# Patient Record
Sex: Female | Born: 1935 | Race: White | Hispanic: No | Smoking: Never smoker
Health system: Southern US, Community
[De-identification: ages and names within clinical notes are randomized; demographics above are authoritative.]

## PROBLEM LIST (undated history)

## (undated) DIAGNOSIS — K439 Ventral hernia without obstruction or gangrene: Secondary | ICD-10-CM

## (undated) DIAGNOSIS — K5790 Diverticulosis of intestine, part unspecified, without perforation or abscess without bleeding: Secondary | ICD-10-CM

## (undated) DIAGNOSIS — S32010A Wedge compression fracture of first lumbar vertebra, initial encounter for closed fracture: Secondary | ICD-10-CM

## (undated) DIAGNOSIS — E119 Type 2 diabetes mellitus without complications: Secondary | ICD-10-CM

## (undated) DIAGNOSIS — K449 Diaphragmatic hernia without obstruction or gangrene: Secondary | ICD-10-CM

## (undated) DIAGNOSIS — I10 Essential (primary) hypertension: Secondary | ICD-10-CM

## (undated) HISTORY — PX: CHOLECYSTECTOMY: SHX55

## (undated) HISTORY — PX: ABDOMINAL HYSTERECTOMY: SHX81

## (undated) HISTORY — PX: HERNIA REPAIR: SHX51

---

## 2015-05-09 DIAGNOSIS — J069 Acute upper respiratory infection, unspecified: Secondary | ICD-10-CM | POA: Diagnosis not present

## 2015-05-21 DIAGNOSIS — N390 Urinary tract infection, site not specified: Secondary | ICD-10-CM | POA: Diagnosis not present

## 2015-06-05 DIAGNOSIS — R32 Unspecified urinary incontinence: Secondary | ICD-10-CM | POA: Diagnosis not present

## 2015-07-04 DIAGNOSIS — M79674 Pain in right toe(s): Secondary | ICD-10-CM | POA: Diagnosis not present

## 2015-07-04 DIAGNOSIS — B351 Tinea unguium: Secondary | ICD-10-CM | POA: Diagnosis not present

## 2015-07-04 DIAGNOSIS — M79675 Pain in left toe(s): Secondary | ICD-10-CM | POA: Diagnosis not present

## 2015-07-04 DIAGNOSIS — E119 Type 2 diabetes mellitus without complications: Secondary | ICD-10-CM | POA: Diagnosis not present

## 2015-07-09 DIAGNOSIS — N390 Urinary tract infection, site not specified: Secondary | ICD-10-CM | POA: Diagnosis not present

## 2015-07-09 DIAGNOSIS — R32 Unspecified urinary incontinence: Secondary | ICD-10-CM | POA: Diagnosis not present

## 2015-07-22 DIAGNOSIS — R197 Diarrhea, unspecified: Secondary | ICD-10-CM | POA: Diagnosis not present

## 2015-07-22 DIAGNOSIS — N390 Urinary tract infection, site not specified: Secondary | ICD-10-CM | POA: Diagnosis not present

## 2015-07-22 DIAGNOSIS — M549 Dorsalgia, unspecified: Secondary | ICD-10-CM | POA: Diagnosis not present

## 2015-07-23 DIAGNOSIS — R197 Diarrhea, unspecified: Secondary | ICD-10-CM | POA: Diagnosis not present

## 2015-08-27 DIAGNOSIS — I350 Nonrheumatic aortic (valve) stenosis: Secondary | ICD-10-CM | POA: Diagnosis not present

## 2015-08-27 DIAGNOSIS — I10 Essential (primary) hypertension: Secondary | ICD-10-CM | POA: Diagnosis not present

## 2015-08-27 DIAGNOSIS — Z954 Presence of other heart-valve replacement: Secondary | ICD-10-CM | POA: Diagnosis not present

## 2015-08-27 DIAGNOSIS — E785 Hyperlipidemia, unspecified: Secondary | ICD-10-CM | POA: Diagnosis not present

## 2015-09-15 DIAGNOSIS — M9905 Segmental and somatic dysfunction of pelvic region: Secondary | ICD-10-CM | POA: Diagnosis not present

## 2015-09-15 DIAGNOSIS — M9903 Segmental and somatic dysfunction of lumbar region: Secondary | ICD-10-CM | POA: Diagnosis not present

## 2015-09-18 DIAGNOSIS — M9905 Segmental and somatic dysfunction of pelvic region: Secondary | ICD-10-CM | POA: Diagnosis not present

## 2015-09-18 DIAGNOSIS — M9903 Segmental and somatic dysfunction of lumbar region: Secondary | ICD-10-CM | POA: Diagnosis not present

## 2015-09-24 DIAGNOSIS — M9905 Segmental and somatic dysfunction of pelvic region: Secondary | ICD-10-CM | POA: Diagnosis not present

## 2015-09-24 DIAGNOSIS — M9903 Segmental and somatic dysfunction of lumbar region: Secondary | ICD-10-CM | POA: Diagnosis not present

## 2015-09-26 DIAGNOSIS — M9903 Segmental and somatic dysfunction of lumbar region: Secondary | ICD-10-CM | POA: Diagnosis not present

## 2015-09-26 DIAGNOSIS — M9905 Segmental and somatic dysfunction of pelvic region: Secondary | ICD-10-CM | POA: Diagnosis not present

## 2015-09-29 DIAGNOSIS — M9903 Segmental and somatic dysfunction of lumbar region: Secondary | ICD-10-CM | POA: Diagnosis not present

## 2015-09-29 DIAGNOSIS — M9905 Segmental and somatic dysfunction of pelvic region: Secondary | ICD-10-CM | POA: Diagnosis not present

## 2015-09-30 DIAGNOSIS — R339 Retention of urine, unspecified: Secondary | ICD-10-CM | POA: Diagnosis not present

## 2015-09-30 DIAGNOSIS — N3941 Urge incontinence: Secondary | ICD-10-CM | POA: Diagnosis not present

## 2015-09-30 DIAGNOSIS — N952 Postmenopausal atrophic vaginitis: Secondary | ICD-10-CM | POA: Diagnosis not present

## 2015-09-30 DIAGNOSIS — R350 Frequency of micturition: Secondary | ICD-10-CM | POA: Diagnosis not present

## 2015-09-30 DIAGNOSIS — N816 Rectocele: Secondary | ICD-10-CM | POA: Diagnosis not present

## 2015-10-01 DIAGNOSIS — M9905 Segmental and somatic dysfunction of pelvic region: Secondary | ICD-10-CM | POA: Diagnosis not present

## 2015-10-01 DIAGNOSIS — M9903 Segmental and somatic dysfunction of lumbar region: Secondary | ICD-10-CM | POA: Diagnosis not present

## 2015-10-06 DIAGNOSIS — M9905 Segmental and somatic dysfunction of pelvic region: Secondary | ICD-10-CM | POA: Diagnosis not present

## 2015-10-06 DIAGNOSIS — M9903 Segmental and somatic dysfunction of lumbar region: Secondary | ICD-10-CM | POA: Diagnosis not present

## 2015-10-13 DIAGNOSIS — M9905 Segmental and somatic dysfunction of pelvic region: Secondary | ICD-10-CM | POA: Diagnosis not present

## 2015-10-13 DIAGNOSIS — M9903 Segmental and somatic dysfunction of lumbar region: Secondary | ICD-10-CM | POA: Diagnosis not present

## 2015-10-14 DIAGNOSIS — M79674 Pain in right toe(s): Secondary | ICD-10-CM | POA: Diagnosis not present

## 2015-10-14 DIAGNOSIS — M79675 Pain in left toe(s): Secondary | ICD-10-CM | POA: Diagnosis not present

## 2015-10-14 DIAGNOSIS — E119 Type 2 diabetes mellitus without complications: Secondary | ICD-10-CM | POA: Diagnosis not present

## 2015-10-14 DIAGNOSIS — B351 Tinea unguium: Secondary | ICD-10-CM | POA: Diagnosis not present

## 2015-10-15 DIAGNOSIS — M9905 Segmental and somatic dysfunction of pelvic region: Secondary | ICD-10-CM | POA: Diagnosis not present

## 2015-10-15 DIAGNOSIS — M9903 Segmental and somatic dysfunction of lumbar region: Secondary | ICD-10-CM | POA: Diagnosis not present

## 2015-10-17 DIAGNOSIS — M9905 Segmental and somatic dysfunction of pelvic region: Secondary | ICD-10-CM | POA: Diagnosis not present

## 2015-10-17 DIAGNOSIS — M9903 Segmental and somatic dysfunction of lumbar region: Secondary | ICD-10-CM | POA: Diagnosis not present

## 2015-10-21 DIAGNOSIS — N760 Acute vaginitis: Secondary | ICD-10-CM | POA: Diagnosis not present

## 2015-10-21 DIAGNOSIS — N3941 Urge incontinence: Secondary | ICD-10-CM | POA: Diagnosis not present

## 2015-10-21 DIAGNOSIS — R339 Retention of urine, unspecified: Secondary | ICD-10-CM | POA: Diagnosis not present

## 2015-10-21 DIAGNOSIS — N816 Rectocele: Secondary | ICD-10-CM | POA: Diagnosis not present

## 2015-10-22 DIAGNOSIS — M9903 Segmental and somatic dysfunction of lumbar region: Secondary | ICD-10-CM | POA: Diagnosis not present

## 2015-10-22 DIAGNOSIS — M9905 Segmental and somatic dysfunction of pelvic region: Secondary | ICD-10-CM | POA: Diagnosis not present

## 2015-10-24 DIAGNOSIS — M9903 Segmental and somatic dysfunction of lumbar region: Secondary | ICD-10-CM | POA: Diagnosis not present

## 2015-10-24 DIAGNOSIS — M9905 Segmental and somatic dysfunction of pelvic region: Secondary | ICD-10-CM | POA: Diagnosis not present

## 2015-10-27 DIAGNOSIS — M9905 Segmental and somatic dysfunction of pelvic region: Secondary | ICD-10-CM | POA: Diagnosis not present

## 2015-10-27 DIAGNOSIS — M9903 Segmental and somatic dysfunction of lumbar region: Secondary | ICD-10-CM | POA: Diagnosis not present

## 2015-10-29 DIAGNOSIS — R159 Full incontinence of feces: Secondary | ICD-10-CM | POA: Diagnosis not present

## 2015-10-29 DIAGNOSIS — N815 Vaginal enterocele: Secondary | ICD-10-CM | POA: Diagnosis not present

## 2015-10-29 DIAGNOSIS — R339 Retention of urine, unspecified: Secondary | ICD-10-CM | POA: Diagnosis not present

## 2015-10-29 DIAGNOSIS — N3941 Urge incontinence: Secondary | ICD-10-CM | POA: Diagnosis not present

## 2015-10-29 DIAGNOSIS — N760 Acute vaginitis: Secondary | ICD-10-CM | POA: Diagnosis not present

## 2015-10-31 DIAGNOSIS — M9903 Segmental and somatic dysfunction of lumbar region: Secondary | ICD-10-CM | POA: Diagnosis not present

## 2015-10-31 DIAGNOSIS — M9905 Segmental and somatic dysfunction of pelvic region: Secondary | ICD-10-CM | POA: Diagnosis not present

## 2015-11-03 DIAGNOSIS — M9905 Segmental and somatic dysfunction of pelvic region: Secondary | ICD-10-CM | POA: Diagnosis not present

## 2015-11-03 DIAGNOSIS — M9903 Segmental and somatic dysfunction of lumbar region: Secondary | ICD-10-CM | POA: Diagnosis not present

## 2015-11-07 DIAGNOSIS — M9905 Segmental and somatic dysfunction of pelvic region: Secondary | ICD-10-CM | POA: Diagnosis not present

## 2015-11-07 DIAGNOSIS — M9903 Segmental and somatic dysfunction of lumbar region: Secondary | ICD-10-CM | POA: Diagnosis not present

## 2015-11-10 DIAGNOSIS — M9905 Segmental and somatic dysfunction of pelvic region: Secondary | ICD-10-CM | POA: Diagnosis not present

## 2015-11-10 DIAGNOSIS — M9903 Segmental and somatic dysfunction of lumbar region: Secondary | ICD-10-CM | POA: Diagnosis not present

## 2015-11-14 DIAGNOSIS — M9905 Segmental and somatic dysfunction of pelvic region: Secondary | ICD-10-CM | POA: Diagnosis not present

## 2015-11-14 DIAGNOSIS — M9903 Segmental and somatic dysfunction of lumbar region: Secondary | ICD-10-CM | POA: Diagnosis not present

## 2015-11-17 DIAGNOSIS — M9905 Segmental and somatic dysfunction of pelvic region: Secondary | ICD-10-CM | POA: Diagnosis not present

## 2015-11-17 DIAGNOSIS — M9903 Segmental and somatic dysfunction of lumbar region: Secondary | ICD-10-CM | POA: Diagnosis not present

## 2015-11-21 DIAGNOSIS — M9905 Segmental and somatic dysfunction of pelvic region: Secondary | ICD-10-CM | POA: Diagnosis not present

## 2015-11-21 DIAGNOSIS — M9903 Segmental and somatic dysfunction of lumbar region: Secondary | ICD-10-CM | POA: Diagnosis not present

## 2015-11-24 DIAGNOSIS — M9903 Segmental and somatic dysfunction of lumbar region: Secondary | ICD-10-CM | POA: Diagnosis not present

## 2015-11-24 DIAGNOSIS — M9905 Segmental and somatic dysfunction of pelvic region: Secondary | ICD-10-CM | POA: Diagnosis not present

## 2015-11-28 DIAGNOSIS — M9905 Segmental and somatic dysfunction of pelvic region: Secondary | ICD-10-CM | POA: Diagnosis not present

## 2015-11-28 DIAGNOSIS — M9903 Segmental and somatic dysfunction of lumbar region: Secondary | ICD-10-CM | POA: Diagnosis not present

## 2015-12-01 DIAGNOSIS — M9905 Segmental and somatic dysfunction of pelvic region: Secondary | ICD-10-CM | POA: Diagnosis not present

## 2015-12-01 DIAGNOSIS — M9903 Segmental and somatic dysfunction of lumbar region: Secondary | ICD-10-CM | POA: Diagnosis not present

## 2015-12-05 DIAGNOSIS — M9903 Segmental and somatic dysfunction of lumbar region: Secondary | ICD-10-CM | POA: Diagnosis not present

## 2015-12-05 DIAGNOSIS — M9905 Segmental and somatic dysfunction of pelvic region: Secondary | ICD-10-CM | POA: Diagnosis not present

## 2015-12-08 DIAGNOSIS — M9905 Segmental and somatic dysfunction of pelvic region: Secondary | ICD-10-CM | POA: Diagnosis not present

## 2015-12-08 DIAGNOSIS — M9903 Segmental and somatic dysfunction of lumbar region: Secondary | ICD-10-CM | POA: Diagnosis not present

## 2015-12-12 DIAGNOSIS — I10 Essential (primary) hypertension: Secondary | ICD-10-CM | POA: Diagnosis not present

## 2015-12-12 DIAGNOSIS — N183 Chronic kidney disease, stage 3 (moderate): Secondary | ICD-10-CM | POA: Diagnosis not present

## 2015-12-12 DIAGNOSIS — E119 Type 2 diabetes mellitus without complications: Secondary | ICD-10-CM | POA: Diagnosis not present

## 2015-12-12 DIAGNOSIS — M4806 Spinal stenosis, lumbar region: Secondary | ICD-10-CM | POA: Diagnosis not present

## 2015-12-12 DIAGNOSIS — R52 Pain, unspecified: Secondary | ICD-10-CM | POA: Diagnosis not present

## 2015-12-12 DIAGNOSIS — G8929 Other chronic pain: Secondary | ICD-10-CM | POA: Diagnosis not present

## 2015-12-12 DIAGNOSIS — M5127 Other intervertebral disc displacement, lumbosacral region: Secondary | ICD-10-CM | POA: Diagnosis not present

## 2015-12-22 DIAGNOSIS — M47816 Spondylosis without myelopathy or radiculopathy, lumbar region: Secondary | ICD-10-CM | POA: Diagnosis not present

## 2015-12-22 DIAGNOSIS — M5127 Other intervertebral disc displacement, lumbosacral region: Secondary | ICD-10-CM | POA: Diagnosis not present

## 2015-12-22 DIAGNOSIS — M5126 Other intervertebral disc displacement, lumbar region: Secondary | ICD-10-CM | POA: Diagnosis not present

## 2015-12-22 DIAGNOSIS — M8448XA Pathological fracture, other site, initial encounter for fracture: Secondary | ICD-10-CM | POA: Diagnosis not present

## 2015-12-31 DIAGNOSIS — M1990 Unspecified osteoarthritis, unspecified site: Secondary | ICD-10-CM | POA: Diagnosis not present

## 2015-12-31 DIAGNOSIS — Z23 Encounter for immunization: Secondary | ICD-10-CM | POA: Diagnosis not present

## 2015-12-31 DIAGNOSIS — M549 Dorsalgia, unspecified: Secondary | ICD-10-CM | POA: Diagnosis not present

## 2015-12-31 DIAGNOSIS — M545 Low back pain: Secondary | ICD-10-CM | POA: Diagnosis not present

## 2015-12-31 DIAGNOSIS — E119 Type 2 diabetes mellitus without complications: Secondary | ICD-10-CM | POA: Diagnosis not present

## 2016-01-02 DIAGNOSIS — M9905 Segmental and somatic dysfunction of pelvic region: Secondary | ICD-10-CM | POA: Diagnosis not present

## 2016-01-02 DIAGNOSIS — M9903 Segmental and somatic dysfunction of lumbar region: Secondary | ICD-10-CM | POA: Diagnosis not present

## 2016-01-07 DIAGNOSIS — M9905 Segmental and somatic dysfunction of pelvic region: Secondary | ICD-10-CM | POA: Diagnosis not present

## 2016-01-07 DIAGNOSIS — M9903 Segmental and somatic dysfunction of lumbar region: Secondary | ICD-10-CM | POA: Diagnosis not present

## 2016-01-09 DIAGNOSIS — M9903 Segmental and somatic dysfunction of lumbar region: Secondary | ICD-10-CM | POA: Diagnosis not present

## 2016-01-09 DIAGNOSIS — M9905 Segmental and somatic dysfunction of pelvic region: Secondary | ICD-10-CM | POA: Diagnosis not present

## 2016-01-12 DIAGNOSIS — M9905 Segmental and somatic dysfunction of pelvic region: Secondary | ICD-10-CM | POA: Diagnosis not present

## 2016-01-12 DIAGNOSIS — M9903 Segmental and somatic dysfunction of lumbar region: Secondary | ICD-10-CM | POA: Diagnosis not present

## 2016-01-20 DIAGNOSIS — M79675 Pain in left toe(s): Secondary | ICD-10-CM | POA: Diagnosis not present

## 2016-01-20 DIAGNOSIS — E119 Type 2 diabetes mellitus without complications: Secondary | ICD-10-CM | POA: Diagnosis not present

## 2016-01-20 DIAGNOSIS — B351 Tinea unguium: Secondary | ICD-10-CM | POA: Diagnosis not present

## 2016-01-20 DIAGNOSIS — M79674 Pain in right toe(s): Secondary | ICD-10-CM | POA: Diagnosis not present

## 2016-05-27 DIAGNOSIS — E119 Type 2 diabetes mellitus without complications: Secondary | ICD-10-CM | POA: Diagnosis not present

## 2016-05-27 DIAGNOSIS — M79675 Pain in left toe(s): Secondary | ICD-10-CM | POA: Diagnosis not present

## 2016-05-27 DIAGNOSIS — M79674 Pain in right toe(s): Secondary | ICD-10-CM | POA: Diagnosis not present

## 2016-05-27 DIAGNOSIS — B351 Tinea unguium: Secondary | ICD-10-CM | POA: Diagnosis not present

## 2016-06-28 DIAGNOSIS — E119 Type 2 diabetes mellitus without complications: Secondary | ICD-10-CM | POA: Diagnosis not present

## 2016-06-28 DIAGNOSIS — Z961 Presence of intraocular lens: Secondary | ICD-10-CM | POA: Diagnosis not present

## 2016-06-28 DIAGNOSIS — H40013 Open angle with borderline findings, low risk, bilateral: Secondary | ICD-10-CM | POA: Diagnosis not present

## 2016-06-30 DIAGNOSIS — E119 Type 2 diabetes mellitus without complications: Secondary | ICD-10-CM | POA: Diagnosis not present

## 2016-06-30 DIAGNOSIS — R399 Unspecified symptoms and signs involving the genitourinary system: Secondary | ICD-10-CM | POA: Diagnosis not present

## 2016-06-30 DIAGNOSIS — R52 Pain, unspecified: Secondary | ICD-10-CM | POA: Diagnosis not present

## 2016-06-30 DIAGNOSIS — E782 Mixed hyperlipidemia: Secondary | ICD-10-CM | POA: Diagnosis not present

## 2016-06-30 DIAGNOSIS — R42 Dizziness and giddiness: Secondary | ICD-10-CM | POA: Diagnosis not present

## 2016-06-30 DIAGNOSIS — E559 Vitamin D deficiency, unspecified: Secondary | ICD-10-CM | POA: Diagnosis not present

## 2016-06-30 DIAGNOSIS — D649 Anemia, unspecified: Secondary | ICD-10-CM | POA: Diagnosis not present

## 2016-06-30 DIAGNOSIS — R197 Diarrhea, unspecified: Secondary | ICD-10-CM | POA: Diagnosis not present

## 2016-06-30 DIAGNOSIS — I10 Essential (primary) hypertension: Secondary | ICD-10-CM | POA: Diagnosis not present

## 2016-06-30 DIAGNOSIS — N390 Urinary tract infection, site not specified: Secondary | ICD-10-CM | POA: Diagnosis not present

## 2016-06-30 DIAGNOSIS — F329 Major depressive disorder, single episode, unspecified: Secondary | ICD-10-CM | POA: Diagnosis not present

## 2016-07-14 DIAGNOSIS — D649 Anemia, unspecified: Secondary | ICD-10-CM | POA: Diagnosis not present

## 2016-07-14 DIAGNOSIS — E782 Mixed hyperlipidemia: Secondary | ICD-10-CM | POA: Diagnosis not present

## 2016-07-14 DIAGNOSIS — N390 Urinary tract infection, site not specified: Secondary | ICD-10-CM | POA: Diagnosis not present

## 2016-07-14 DIAGNOSIS — I10 Essential (primary) hypertension: Secondary | ICD-10-CM | POA: Diagnosis not present

## 2016-09-01 DIAGNOSIS — R350 Frequency of micturition: Secondary | ICD-10-CM | POA: Diagnosis not present

## 2016-09-01 DIAGNOSIS — Z7984 Long term (current) use of oral hypoglycemic drugs: Secondary | ICD-10-CM | POA: Diagnosis not present

## 2016-09-01 DIAGNOSIS — I10 Essential (primary) hypertension: Secondary | ICD-10-CM | POA: Diagnosis not present

## 2016-09-01 DIAGNOSIS — E119 Type 2 diabetes mellitus without complications: Secondary | ICD-10-CM | POA: Diagnosis not present

## 2016-09-01 DIAGNOSIS — G8929 Other chronic pain: Secondary | ICD-10-CM | POA: Diagnosis not present

## 2016-09-01 DIAGNOSIS — F411 Generalized anxiety disorder: Secondary | ICD-10-CM | POA: Diagnosis not present

## 2016-09-01 DIAGNOSIS — E7800 Pure hypercholesterolemia, unspecified: Secondary | ICD-10-CM | POA: Diagnosis not present

## 2016-09-01 DIAGNOSIS — M549 Dorsalgia, unspecified: Secondary | ICD-10-CM | POA: Diagnosis not present

## 2016-09-28 DIAGNOSIS — E1142 Type 2 diabetes mellitus with diabetic polyneuropathy: Secondary | ICD-10-CM | POA: Diagnosis not present

## 2016-09-28 DIAGNOSIS — M2041 Other hammer toe(s) (acquired), right foot: Secondary | ICD-10-CM | POA: Diagnosis not present

## 2016-09-28 DIAGNOSIS — M2042 Other hammer toe(s) (acquired), left foot: Secondary | ICD-10-CM | POA: Diagnosis not present

## 2016-10-25 DIAGNOSIS — I10 Essential (primary) hypertension: Secondary | ICD-10-CM | POA: Diagnosis not present

## 2016-10-25 DIAGNOSIS — E785 Hyperlipidemia, unspecified: Secondary | ICD-10-CM | POA: Diagnosis not present

## 2016-10-25 DIAGNOSIS — Z953 Presence of xenogenic heart valve: Secondary | ICD-10-CM | POA: Diagnosis not present

## 2016-10-25 DIAGNOSIS — E119 Type 2 diabetes mellitus without complications: Secondary | ICD-10-CM | POA: Diagnosis not present

## 2016-11-05 DIAGNOSIS — N390 Urinary tract infection, site not specified: Secondary | ICD-10-CM | POA: Diagnosis not present

## 2016-11-05 DIAGNOSIS — N3944 Nocturnal enuresis: Secondary | ICD-10-CM | POA: Diagnosis not present

## 2016-11-05 DIAGNOSIS — N3941 Urge incontinence: Secondary | ICD-10-CM | POA: Diagnosis not present

## 2016-12-28 DIAGNOSIS — L603 Nail dystrophy: Secondary | ICD-10-CM | POA: Diagnosis not present

## 2016-12-28 DIAGNOSIS — E1151 Type 2 diabetes mellitus with diabetic peripheral angiopathy without gangrene: Secondary | ICD-10-CM | POA: Diagnosis not present

## 2016-12-28 DIAGNOSIS — I739 Peripheral vascular disease, unspecified: Secondary | ICD-10-CM | POA: Diagnosis not present

## 2016-12-31 DIAGNOSIS — J069 Acute upper respiratory infection, unspecified: Secondary | ICD-10-CM | POA: Diagnosis not present

## 2017-01-20 DIAGNOSIS — M545 Low back pain: Secondary | ICD-10-CM | POA: Diagnosis not present

## 2017-01-20 DIAGNOSIS — Z0000 Encounter for general adult medical examination without abnormal findings: Secondary | ICD-10-CM | POA: Diagnosis not present

## 2017-01-20 DIAGNOSIS — F418 Other specified anxiety disorders: Secondary | ICD-10-CM | POA: Diagnosis not present

## 2017-01-20 DIAGNOSIS — G8929 Other chronic pain: Secondary | ICD-10-CM | POA: Diagnosis not present

## 2017-01-20 DIAGNOSIS — N183 Chronic kidney disease, stage 3 (moderate): Secondary | ICD-10-CM | POA: Diagnosis not present

## 2017-01-20 DIAGNOSIS — E559 Vitamin D deficiency, unspecified: Secondary | ICD-10-CM | POA: Diagnosis not present

## 2017-01-20 DIAGNOSIS — E1122 Type 2 diabetes mellitus with diabetic chronic kidney disease: Secondary | ICD-10-CM | POA: Diagnosis not present

## 2017-01-20 DIAGNOSIS — G629 Polyneuropathy, unspecified: Secondary | ICD-10-CM | POA: Diagnosis not present

## 2017-01-20 DIAGNOSIS — E782 Mixed hyperlipidemia: Secondary | ICD-10-CM | POA: Diagnosis not present

## 2017-01-20 DIAGNOSIS — I10 Essential (primary) hypertension: Secondary | ICD-10-CM | POA: Diagnosis not present

## 2017-01-20 DIAGNOSIS — Z23 Encounter for immunization: Secondary | ICD-10-CM | POA: Diagnosis not present

## 2017-01-20 DIAGNOSIS — N3941 Urge incontinence: Secondary | ICD-10-CM | POA: Diagnosis not present

## 2017-02-10 DIAGNOSIS — R3914 Feeling of incomplete bladder emptying: Secondary | ICD-10-CM | POA: Diagnosis not present

## 2017-02-10 DIAGNOSIS — R32 Unspecified urinary incontinence: Secondary | ICD-10-CM | POA: Diagnosis not present

## 2017-02-10 DIAGNOSIS — N390 Urinary tract infection, site not specified: Secondary | ICD-10-CM | POA: Diagnosis not present

## 2017-04-12 DIAGNOSIS — E1142 Type 2 diabetes mellitus with diabetic polyneuropathy: Secondary | ICD-10-CM | POA: Diagnosis not present

## 2017-04-27 DIAGNOSIS — M545 Low back pain: Secondary | ICD-10-CM | POA: Diagnosis not present

## 2017-04-27 DIAGNOSIS — Z7984 Long term (current) use of oral hypoglycemic drugs: Secondary | ICD-10-CM | POA: Diagnosis not present

## 2017-04-27 DIAGNOSIS — G8929 Other chronic pain: Secondary | ICD-10-CM | POA: Diagnosis not present

## 2017-04-27 DIAGNOSIS — E119 Type 2 diabetes mellitus without complications: Secondary | ICD-10-CM | POA: Diagnosis not present

## 2017-04-27 DIAGNOSIS — E7800 Pure hypercholesterolemia, unspecified: Secondary | ICD-10-CM | POA: Diagnosis not present

## 2017-04-27 DIAGNOSIS — R5381 Other malaise: Secondary | ICD-10-CM | POA: Diagnosis not present

## 2017-04-27 DIAGNOSIS — N183 Chronic kidney disease, stage 3 (moderate): Secondary | ICD-10-CM | POA: Diagnosis not present

## 2017-04-27 DIAGNOSIS — I10 Essential (primary) hypertension: Secondary | ICD-10-CM | POA: Diagnosis not present

## 2017-04-27 DIAGNOSIS — N393 Stress incontinence (female) (male): Secondary | ICD-10-CM | POA: Diagnosis not present

## 2017-04-27 DIAGNOSIS — F418 Other specified anxiety disorders: Secondary | ICD-10-CM | POA: Diagnosis not present

## 2017-04-27 DIAGNOSIS — F411 Generalized anxiety disorder: Secondary | ICD-10-CM | POA: Diagnosis not present

## 2017-05-11 DIAGNOSIS — E1122 Type 2 diabetes mellitus with diabetic chronic kidney disease: Secondary | ICD-10-CM | POA: Diagnosis not present

## 2017-05-11 DIAGNOSIS — M1990 Unspecified osteoarthritis, unspecified site: Secondary | ICD-10-CM | POA: Diagnosis not present

## 2017-05-11 DIAGNOSIS — N3941 Urge incontinence: Secondary | ICD-10-CM | POA: Diagnosis not present

## 2017-05-11 DIAGNOSIS — M48061 Spinal stenosis, lumbar region without neurogenic claudication: Secondary | ICD-10-CM | POA: Diagnosis not present

## 2017-05-11 DIAGNOSIS — F411 Generalized anxiety disorder: Secondary | ICD-10-CM | POA: Diagnosis not present

## 2017-05-11 DIAGNOSIS — Z7984 Long term (current) use of oral hypoglycemic drugs: Secondary | ICD-10-CM | POA: Diagnosis not present

## 2017-05-11 DIAGNOSIS — G8929 Other chronic pain: Secondary | ICD-10-CM | POA: Diagnosis not present

## 2017-05-11 DIAGNOSIS — N183 Chronic kidney disease, stage 3 (moderate): Secondary | ICD-10-CM | POA: Diagnosis not present

## 2017-05-11 DIAGNOSIS — M519 Unspecified thoracic, thoracolumbar and lumbosacral intervertebral disc disorder: Secondary | ICD-10-CM | POA: Diagnosis not present

## 2017-05-11 DIAGNOSIS — E538 Deficiency of other specified B group vitamins: Secondary | ICD-10-CM | POA: Diagnosis not present

## 2017-05-11 DIAGNOSIS — Z9181 History of falling: Secondary | ICD-10-CM | POA: Diagnosis not present

## 2017-05-11 DIAGNOSIS — H409 Unspecified glaucoma: Secondary | ICD-10-CM | POA: Diagnosis not present

## 2017-05-11 DIAGNOSIS — M6281 Muscle weakness (generalized): Secondary | ICD-10-CM | POA: Diagnosis not present

## 2017-05-11 DIAGNOSIS — I129 Hypertensive chronic kidney disease with stage 1 through stage 4 chronic kidney disease, or unspecified chronic kidney disease: Secondary | ICD-10-CM | POA: Diagnosis not present

## 2017-05-11 DIAGNOSIS — Z7982 Long term (current) use of aspirin: Secondary | ICD-10-CM | POA: Diagnosis not present

## 2017-05-13 DIAGNOSIS — M519 Unspecified thoracic, thoracolumbar and lumbosacral intervertebral disc disorder: Secondary | ICD-10-CM | POA: Diagnosis not present

## 2017-05-13 DIAGNOSIS — G8929 Other chronic pain: Secondary | ICD-10-CM | POA: Diagnosis not present

## 2017-05-13 DIAGNOSIS — E1122 Type 2 diabetes mellitus with diabetic chronic kidney disease: Secondary | ICD-10-CM | POA: Diagnosis not present

## 2017-05-13 DIAGNOSIS — M48061 Spinal stenosis, lumbar region without neurogenic claudication: Secondary | ICD-10-CM | POA: Diagnosis not present

## 2017-05-13 DIAGNOSIS — M1990 Unspecified osteoarthritis, unspecified site: Secondary | ICD-10-CM | POA: Diagnosis not present

## 2017-05-13 DIAGNOSIS — M6281 Muscle weakness (generalized): Secondary | ICD-10-CM | POA: Diagnosis not present

## 2017-05-16 DIAGNOSIS — M1990 Unspecified osteoarthritis, unspecified site: Secondary | ICD-10-CM | POA: Diagnosis not present

## 2017-05-16 DIAGNOSIS — E1122 Type 2 diabetes mellitus with diabetic chronic kidney disease: Secondary | ICD-10-CM | POA: Diagnosis not present

## 2017-05-16 DIAGNOSIS — M6281 Muscle weakness (generalized): Secondary | ICD-10-CM | POA: Diagnosis not present

## 2017-05-16 DIAGNOSIS — M519 Unspecified thoracic, thoracolumbar and lumbosacral intervertebral disc disorder: Secondary | ICD-10-CM | POA: Diagnosis not present

## 2017-05-16 DIAGNOSIS — G8929 Other chronic pain: Secondary | ICD-10-CM | POA: Diagnosis not present

## 2017-05-16 DIAGNOSIS — M48061 Spinal stenosis, lumbar region without neurogenic claudication: Secondary | ICD-10-CM | POA: Diagnosis not present

## 2017-05-19 DIAGNOSIS — G8929 Other chronic pain: Secondary | ICD-10-CM | POA: Diagnosis not present

## 2017-05-19 DIAGNOSIS — M519 Unspecified thoracic, thoracolumbar and lumbosacral intervertebral disc disorder: Secondary | ICD-10-CM | POA: Diagnosis not present

## 2017-05-19 DIAGNOSIS — M48061 Spinal stenosis, lumbar region without neurogenic claudication: Secondary | ICD-10-CM | POA: Diagnosis not present

## 2017-05-19 DIAGNOSIS — M6281 Muscle weakness (generalized): Secondary | ICD-10-CM | POA: Diagnosis not present

## 2017-05-19 DIAGNOSIS — M1990 Unspecified osteoarthritis, unspecified site: Secondary | ICD-10-CM | POA: Diagnosis not present

## 2017-05-19 DIAGNOSIS — E1122 Type 2 diabetes mellitus with diabetic chronic kidney disease: Secondary | ICD-10-CM | POA: Diagnosis not present

## 2017-05-24 DIAGNOSIS — M6281 Muscle weakness (generalized): Secondary | ICD-10-CM | POA: Diagnosis not present

## 2017-05-24 DIAGNOSIS — G8929 Other chronic pain: Secondary | ICD-10-CM | POA: Diagnosis not present

## 2017-05-24 DIAGNOSIS — M519 Unspecified thoracic, thoracolumbar and lumbosacral intervertebral disc disorder: Secondary | ICD-10-CM | POA: Diagnosis not present

## 2017-05-24 DIAGNOSIS — M48061 Spinal stenosis, lumbar region without neurogenic claudication: Secondary | ICD-10-CM | POA: Diagnosis not present

## 2017-05-24 DIAGNOSIS — E1122 Type 2 diabetes mellitus with diabetic chronic kidney disease: Secondary | ICD-10-CM | POA: Diagnosis not present

## 2017-05-24 DIAGNOSIS — M1990 Unspecified osteoarthritis, unspecified site: Secondary | ICD-10-CM | POA: Diagnosis not present

## 2017-05-27 DIAGNOSIS — M6281 Muscle weakness (generalized): Secondary | ICD-10-CM | POA: Diagnosis not present

## 2017-05-27 DIAGNOSIS — M48061 Spinal stenosis, lumbar region without neurogenic claudication: Secondary | ICD-10-CM | POA: Diagnosis not present

## 2017-05-27 DIAGNOSIS — M519 Unspecified thoracic, thoracolumbar and lumbosacral intervertebral disc disorder: Secondary | ICD-10-CM | POA: Diagnosis not present

## 2017-05-27 DIAGNOSIS — M1990 Unspecified osteoarthritis, unspecified site: Secondary | ICD-10-CM | POA: Diagnosis not present

## 2017-05-27 DIAGNOSIS — G8929 Other chronic pain: Secondary | ICD-10-CM | POA: Diagnosis not present

## 2017-05-27 DIAGNOSIS — E1122 Type 2 diabetes mellitus with diabetic chronic kidney disease: Secondary | ICD-10-CM | POA: Diagnosis not present

## 2017-06-28 DIAGNOSIS — E1151 Type 2 diabetes mellitus with diabetic peripheral angiopathy without gangrene: Secondary | ICD-10-CM | POA: Diagnosis not present

## 2017-06-28 DIAGNOSIS — L603 Nail dystrophy: Secondary | ICD-10-CM | POA: Diagnosis not present

## 2017-06-28 DIAGNOSIS — I739 Peripheral vascular disease, unspecified: Secondary | ICD-10-CM | POA: Diagnosis not present

## 2017-08-25 DIAGNOSIS — F418 Other specified anxiety disorders: Secondary | ICD-10-CM | POA: Diagnosis not present

## 2017-08-25 DIAGNOSIS — Z7984 Long term (current) use of oral hypoglycemic drugs: Secondary | ICD-10-CM | POA: Diagnosis not present

## 2017-08-25 DIAGNOSIS — J069 Acute upper respiratory infection, unspecified: Secondary | ICD-10-CM | POA: Diagnosis not present

## 2017-08-25 DIAGNOSIS — N390 Urinary tract infection, site not specified: Secondary | ICD-10-CM | POA: Diagnosis not present

## 2017-08-25 DIAGNOSIS — E119 Type 2 diabetes mellitus without complications: Secondary | ICD-10-CM | POA: Diagnosis not present

## 2017-08-25 DIAGNOSIS — F411 Generalized anxiety disorder: Secondary | ICD-10-CM | POA: Diagnosis not present

## 2017-08-25 DIAGNOSIS — G8929 Other chronic pain: Secondary | ICD-10-CM | POA: Diagnosis not present

## 2017-08-25 DIAGNOSIS — E782 Mixed hyperlipidemia: Secondary | ICD-10-CM | POA: Diagnosis not present

## 2017-08-25 DIAGNOSIS — R6889 Other general symptoms and signs: Secondary | ICD-10-CM | POA: Diagnosis not present

## 2017-08-25 DIAGNOSIS — R350 Frequency of micturition: Secondary | ICD-10-CM | POA: Diagnosis not present

## 2017-08-25 DIAGNOSIS — I10 Essential (primary) hypertension: Secondary | ICD-10-CM | POA: Diagnosis not present

## 2017-08-25 DIAGNOSIS — N3946 Mixed incontinence: Secondary | ICD-10-CM | POA: Diagnosis not present

## 2017-08-25 DIAGNOSIS — Z7409 Other reduced mobility: Secondary | ICD-10-CM | POA: Diagnosis not present

## 2017-09-15 DIAGNOSIS — E1151 Type 2 diabetes mellitus with diabetic peripheral angiopathy without gangrene: Secondary | ICD-10-CM | POA: Diagnosis not present

## 2017-09-15 DIAGNOSIS — I739 Peripheral vascular disease, unspecified: Secondary | ICD-10-CM | POA: Diagnosis not present

## 2017-09-15 DIAGNOSIS — L603 Nail dystrophy: Secondary | ICD-10-CM | POA: Diagnosis not present

## 2017-11-28 DIAGNOSIS — G629 Polyneuropathy, unspecified: Secondary | ICD-10-CM | POA: Diagnosis not present

## 2017-11-28 DIAGNOSIS — G8929 Other chronic pain: Secondary | ICD-10-CM | POA: Diagnosis not present

## 2017-11-28 DIAGNOSIS — K5900 Constipation, unspecified: Secondary | ICD-10-CM | POA: Diagnosis not present

## 2017-11-28 DIAGNOSIS — E119 Type 2 diabetes mellitus without complications: Secondary | ICD-10-CM | POA: Diagnosis not present

## 2017-11-28 DIAGNOSIS — I10 Essential (primary) hypertension: Secondary | ICD-10-CM | POA: Diagnosis not present

## 2017-11-28 DIAGNOSIS — Z7984 Long term (current) use of oral hypoglycemic drugs: Secondary | ICD-10-CM | POA: Diagnosis not present

## 2017-11-28 DIAGNOSIS — E7800 Pure hypercholesterolemia, unspecified: Secondary | ICD-10-CM | POA: Diagnosis not present

## 2017-11-28 DIAGNOSIS — E1122 Type 2 diabetes mellitus with diabetic chronic kidney disease: Secondary | ICD-10-CM | POA: Diagnosis not present

## 2017-12-19 DIAGNOSIS — D225 Melanocytic nevi of trunk: Secondary | ICD-10-CM | POA: Diagnosis not present

## 2017-12-19 DIAGNOSIS — L905 Scar conditions and fibrosis of skin: Secondary | ICD-10-CM | POA: Diagnosis not present

## 2017-12-19 DIAGNOSIS — L821 Other seborrheic keratosis: Secondary | ICD-10-CM | POA: Diagnosis not present

## 2017-12-22 DIAGNOSIS — E1142 Type 2 diabetes mellitus with diabetic polyneuropathy: Secondary | ICD-10-CM | POA: Diagnosis not present

## 2017-12-29 DIAGNOSIS — Z953 Presence of xenogenic heart valve: Secondary | ICD-10-CM | POA: Diagnosis not present

## 2017-12-29 DIAGNOSIS — I5032 Chronic diastolic (congestive) heart failure: Secondary | ICD-10-CM | POA: Diagnosis not present

## 2017-12-29 DIAGNOSIS — I10 Essential (primary) hypertension: Secondary | ICD-10-CM | POA: Diagnosis not present

## 2017-12-29 DIAGNOSIS — R0989 Other specified symptoms and signs involving the circulatory and respiratory systems: Secondary | ICD-10-CM | POA: Diagnosis not present

## 2018-01-05 DIAGNOSIS — E119 Type 2 diabetes mellitus without complications: Secondary | ICD-10-CM | POA: Diagnosis not present

## 2018-01-05 DIAGNOSIS — H43813 Vitreous degeneration, bilateral: Secondary | ICD-10-CM | POA: Diagnosis not present

## 2018-02-09 DIAGNOSIS — R0989 Other specified symptoms and signs involving the circulatory and respiratory systems: Secondary | ICD-10-CM | POA: Diagnosis not present

## 2018-02-09 DIAGNOSIS — Z953 Presence of xenogenic heart valve: Secondary | ICD-10-CM | POA: Diagnosis not present

## 2018-02-28 DIAGNOSIS — E119 Type 2 diabetes mellitus without complications: Secondary | ICD-10-CM | POA: Diagnosis not present

## 2018-02-28 DIAGNOSIS — R8290 Unspecified abnormal findings in urine: Secondary | ICD-10-CM | POA: Diagnosis not present

## 2018-02-28 DIAGNOSIS — F411 Generalized anxiety disorder: Secondary | ICD-10-CM | POA: Diagnosis not present

## 2018-02-28 DIAGNOSIS — E1122 Type 2 diabetes mellitus with diabetic chronic kidney disease: Secondary | ICD-10-CM | POA: Diagnosis not present

## 2018-02-28 DIAGNOSIS — Z952 Presence of prosthetic heart valve: Secondary | ICD-10-CM | POA: Diagnosis not present

## 2018-02-28 DIAGNOSIS — Z0000 Encounter for general adult medical examination without abnormal findings: Secondary | ICD-10-CM | POA: Diagnosis not present

## 2018-02-28 DIAGNOSIS — Z23 Encounter for immunization: Secondary | ICD-10-CM | POA: Diagnosis not present

## 2018-02-28 DIAGNOSIS — E559 Vitamin D deficiency, unspecified: Secondary | ICD-10-CM | POA: Diagnosis not present

## 2018-02-28 DIAGNOSIS — I10 Essential (primary) hypertension: Secondary | ICD-10-CM | POA: Diagnosis not present

## 2018-02-28 DIAGNOSIS — E782 Mixed hyperlipidemia: Secondary | ICD-10-CM | POA: Diagnosis not present

## 2018-02-28 DIAGNOSIS — F418 Other specified anxiety disorders: Secondary | ICD-10-CM | POA: Diagnosis not present

## 2018-02-28 DIAGNOSIS — N183 Chronic kidney disease, stage 3 (moderate): Secondary | ICD-10-CM | POA: Diagnosis not present

## 2018-03-10 DIAGNOSIS — Z23 Encounter for immunization: Secondary | ICD-10-CM | POA: Diagnosis not present

## 2018-04-11 DIAGNOSIS — L603 Nail dystrophy: Secondary | ICD-10-CM | POA: Diagnosis not present

## 2018-04-11 DIAGNOSIS — E1151 Type 2 diabetes mellitus with diabetic peripheral angiopathy without gangrene: Secondary | ICD-10-CM | POA: Diagnosis not present

## 2018-04-11 DIAGNOSIS — I739 Peripheral vascular disease, unspecified: Secondary | ICD-10-CM | POA: Diagnosis not present

## 2018-05-05 DIAGNOSIS — M5416 Radiculopathy, lumbar region: Secondary | ICD-10-CM | POA: Diagnosis not present

## 2018-05-05 DIAGNOSIS — M545 Low back pain: Secondary | ICD-10-CM | POA: Diagnosis not present

## 2018-05-08 DIAGNOSIS — Z23 Encounter for immunization: Secondary | ICD-10-CM | POA: Diagnosis not present

## 2018-05-08 DIAGNOSIS — D225 Melanocytic nevi of trunk: Secondary | ICD-10-CM | POA: Diagnosis not present

## 2018-05-08 DIAGNOSIS — L814 Other melanin hyperpigmentation: Secondary | ICD-10-CM | POA: Diagnosis not present

## 2018-05-08 DIAGNOSIS — L821 Other seborrheic keratosis: Secondary | ICD-10-CM | POA: Diagnosis not present

## 2018-05-08 DIAGNOSIS — L905 Scar conditions and fibrosis of skin: Secondary | ICD-10-CM | POA: Diagnosis not present

## 2018-05-10 ENCOUNTER — Other Ambulatory Visit: Payer: Self-pay | Admitting: Orthopedic Surgery

## 2018-05-10 DIAGNOSIS — M5416 Radiculopathy, lumbar region: Secondary | ICD-10-CM

## 2018-05-10 DIAGNOSIS — M545 Low back pain, unspecified: Secondary | ICD-10-CM

## 2018-05-26 ENCOUNTER — Ambulatory Visit
Admission: RE | Admit: 2018-05-26 | Discharge: 2018-05-26 | Disposition: A | Discharge status: Home / Self Care | Payer: Medicare Other | Source: Ambulatory Visit | Attending: Orthopedic Surgery | Admitting: Orthopedic Surgery

## 2018-05-26 DIAGNOSIS — M48061 Spinal stenosis, lumbar region without neurogenic claudication: Secondary | ICD-10-CM | POA: Diagnosis not present

## 2018-05-26 DIAGNOSIS — M545 Low back pain, unspecified: Secondary | ICD-10-CM

## 2018-05-26 DIAGNOSIS — M5416 Radiculopathy, lumbar region: Secondary | ICD-10-CM

## 2018-05-26 DIAGNOSIS — M5116 Intervertebral disc disorders with radiculopathy, lumbar region: Secondary | ICD-10-CM | POA: Diagnosis not present

## 2018-05-26 DIAGNOSIS — M4726 Other spondylosis with radiculopathy, lumbar region: Secondary | ICD-10-CM | POA: Diagnosis not present

## 2018-06-02 DIAGNOSIS — M545 Low back pain: Secondary | ICD-10-CM | POA: Diagnosis not present

## 2018-06-09 DIAGNOSIS — M47816 Spondylosis without myelopathy or radiculopathy, lumbar region: Secondary | ICD-10-CM | POA: Diagnosis not present

## 2018-06-21 DIAGNOSIS — G8929 Other chronic pain: Secondary | ICD-10-CM | POA: Diagnosis not present

## 2018-06-21 DIAGNOSIS — F418 Other specified anxiety disorders: Secondary | ICD-10-CM | POA: Diagnosis not present

## 2018-06-29 ENCOUNTER — Ambulatory Visit: Payer: Self-pay | Admitting: Cardiology

## 2018-07-04 DIAGNOSIS — M47816 Spondylosis without myelopathy or radiculopathy, lumbar region: Secondary | ICD-10-CM | POA: Diagnosis not present

## 2018-07-06 ENCOUNTER — Telehealth: Payer: Self-pay

## 2018-07-07 NOTE — Telephone Encounter (Signed)
I do not know her and not appropriate to give initial consult over phone, if patient is frail, she can be treated at home and not have to see anyone. Have to discuss with PCP regarding options.

## 2018-07-12 NOTE — Telephone Encounter (Signed)
Normal heart function. She has h/o AV replacement. Moderate aortic stenosis. Aortic valve is between the main chamber of the heart and the aorta. Its job is to prevent blood leaking back into the heart when the heart has finished pumping the blood out and is relaxing.  AS means Aortic stenosis, narrowing on the aortic valve. it can be mild, moderate or severe. Valve replacement needed only if severe AS. Otherwise simply to be monitored  by doctors or serial echocardiograms depending upon clinical situation.

## 2018-07-13 ENCOUNTER — Telehealth: Payer: Self-pay

## 2018-07-13 NOTE — Telephone Encounter (Signed)
I gave pt results over the phone; She has a follow up on Monday and wants to know since everything was ok can they reschedule 2 months out

## 2018-07-13 NOTE — Telephone Encounter (Signed)
Yes, I am fine rescheduling for later if no problems at this time. But she will need to be seen. 1-2 months out is ok

## 2018-07-17 ENCOUNTER — Ambulatory Visit: Payer: Self-pay | Admitting: Cardiology

## 2018-08-07 DIAGNOSIS — M47816 Spondylosis without myelopathy or radiculopathy, lumbar region: Secondary | ICD-10-CM | POA: Diagnosis not present

## 2018-09-11 DIAGNOSIS — G894 Chronic pain syndrome: Secondary | ICD-10-CM | POA: Diagnosis not present

## 2018-10-14 ENCOUNTER — Encounter (HOSPITAL_COMMUNITY): Payer: Self-pay | Admitting: Emergency Medicine

## 2018-10-14 ENCOUNTER — Other Ambulatory Visit: Payer: Self-pay

## 2018-10-14 ENCOUNTER — Inpatient Hospital Stay (HOSPITAL_COMMUNITY)
Admission: EM | Admit: 2018-10-14 | Discharge: 2018-10-16 | DRG: 313 | Disposition: A | Discharge status: Home / Self Care | Payer: Medicare Other | Attending: Internal Medicine | Admitting: Internal Medicine

## 2018-10-14 ENCOUNTER — Emergency Department (HOSPITAL_COMMUNITY): Payer: Medicare Other

## 2018-10-14 DIAGNOSIS — Z7984 Long term (current) use of oral hypoglycemic drugs: Secondary | ICD-10-CM

## 2018-10-14 DIAGNOSIS — Z79899 Other long term (current) drug therapy: Secondary | ICD-10-CM

## 2018-10-14 DIAGNOSIS — R079 Chest pain, unspecified: Secondary | ICD-10-CM | POA: Diagnosis not present

## 2018-10-14 DIAGNOSIS — E119 Type 2 diabetes mellitus without complications: Secondary | ICD-10-CM | POA: Diagnosis not present

## 2018-10-14 DIAGNOSIS — R7989 Other specified abnormal findings of blood chemistry: Secondary | ICD-10-CM | POA: Diagnosis not present

## 2018-10-14 DIAGNOSIS — Z1159 Encounter for screening for other viral diseases: Secondary | ICD-10-CM

## 2018-10-14 DIAGNOSIS — R457 State of emotional shock and stress, unspecified: Secondary | ICD-10-CM | POA: Diagnosis not present

## 2018-10-14 DIAGNOSIS — Z79891 Long term (current) use of opiate analgesic: Secondary | ICD-10-CM

## 2018-10-14 DIAGNOSIS — I4510 Unspecified right bundle-branch block: Secondary | ICD-10-CM | POA: Diagnosis not present

## 2018-10-14 DIAGNOSIS — M4856XA Collapsed vertebra, not elsewhere classified, lumbar region, initial encounter for fracture: Secondary | ICD-10-CM | POA: Diagnosis present

## 2018-10-14 DIAGNOSIS — K439 Ventral hernia without obstruction or gangrene: Secondary | ICD-10-CM | POA: Diagnosis present

## 2018-10-14 DIAGNOSIS — N390 Urinary tract infection, site not specified: Secondary | ICD-10-CM | POA: Diagnosis present

## 2018-10-14 DIAGNOSIS — N3000 Acute cystitis without hematuria: Secondary | ICD-10-CM | POA: Diagnosis not present

## 2018-10-14 DIAGNOSIS — Z20828 Contact with and (suspected) exposure to other viral communicable diseases: Secondary | ICD-10-CM | POA: Diagnosis not present

## 2018-10-14 DIAGNOSIS — I10 Essential (primary) hypertension: Secondary | ICD-10-CM | POA: Diagnosis present

## 2018-10-14 DIAGNOSIS — Z7982 Long term (current) use of aspirin: Secondary | ICD-10-CM

## 2018-10-14 DIAGNOSIS — G8929 Other chronic pain: Secondary | ICD-10-CM | POA: Diagnosis present

## 2018-10-14 DIAGNOSIS — R0789 Other chest pain: Secondary | ICD-10-CM | POA: Diagnosis not present

## 2018-10-14 DIAGNOSIS — K449 Diaphragmatic hernia without obstruction or gangrene: Secondary | ICD-10-CM | POA: Diagnosis present

## 2018-10-14 DIAGNOSIS — S32010A Wedge compression fracture of first lumbar vertebra, initial encounter for closed fracture: Secondary | ICD-10-CM | POA: Diagnosis present

## 2018-10-14 DIAGNOSIS — K5790 Diverticulosis of intestine, part unspecified, without perforation or abscess without bleeding: Secondary | ICD-10-CM | POA: Diagnosis present

## 2018-10-14 DIAGNOSIS — K2970 Gastritis, unspecified, without bleeding: Secondary | ICD-10-CM | POA: Diagnosis present

## 2018-10-14 DIAGNOSIS — K529 Noninfective gastroenteritis and colitis, unspecified: Secondary | ICD-10-CM | POA: Diagnosis present

## 2018-10-14 DIAGNOSIS — R778 Other specified abnormalities of plasma proteins: Secondary | ICD-10-CM

## 2018-10-14 DIAGNOSIS — K5730 Diverticulosis of large intestine without perforation or abscess without bleeding: Secondary | ICD-10-CM | POA: Diagnosis not present

## 2018-10-14 DIAGNOSIS — E785 Hyperlipidemia, unspecified: Secondary | ICD-10-CM | POA: Diagnosis present

## 2018-10-14 HISTORY — DX: Essential (primary) hypertension: I10

## 2018-10-14 HISTORY — DX: Wedge compression fracture of first lumbar vertebra, initial encounter for closed fracture: S32.010A

## 2018-10-14 HISTORY — DX: Diverticulosis of intestine, part unspecified, without perforation or abscess without bleeding: K57.90

## 2018-10-14 HISTORY — DX: Type 2 diabetes mellitus without complications: E11.9

## 2018-10-14 HISTORY — DX: Diaphragmatic hernia without obstruction or gangrene: K44.9

## 2018-10-14 HISTORY — DX: Ventral hernia without obstruction or gangrene: K43.9

## 2018-10-14 LAB — CBC WITH DIFFERENTIAL/PLATELET
Abs Immature Granulocytes: 0.03 10*3/uL (ref 0.00–0.07)
Basophils Absolute: 0 10*3/uL (ref 0.0–0.1)
Basophils Relative: 0 %
Eosinophils Absolute: 0.1 10*3/uL (ref 0.0–0.5)
Eosinophils Relative: 1 %
HCT: 38.7 % (ref 36.0–46.0)
Hemoglobin: 12.4 g/dL (ref 12.0–15.0)
Immature Granulocytes: 0 %
Lymphocytes Relative: 24 %
Lymphs Abs: 2.3 10*3/uL (ref 0.7–4.0)
MCH: 28.6 pg (ref 26.0–34.0)
MCHC: 32 g/dL (ref 30.0–36.0)
MCV: 89.4 fL (ref 80.0–100.0)
Monocytes Absolute: 0.7 10*3/uL (ref 0.1–1.0)
Monocytes Relative: 7 %
Neutro Abs: 6.4 10*3/uL (ref 1.7–7.7)
Neutrophils Relative %: 68 %
Platelets: 263 10*3/uL (ref 150–400)
RBC: 4.33 MIL/uL (ref 3.87–5.11)
RDW: 11.9 % (ref 11.5–15.5)
WBC: 9.6 10*3/uL (ref 4.0–10.5)
nRBC: 0 % (ref 0.0–0.2)

## 2018-10-14 LAB — COMPREHENSIVE METABOLIC PANEL
ALT: 16 U/L (ref 0–44)
AST: 22 U/L (ref 15–41)
Albumin: 4 g/dL (ref 3.5–5.0)
Alkaline Phosphatase: 64 U/L (ref 38–126)
Anion gap: 11 (ref 5–15)
BUN: 30 mg/dL — ABNORMAL HIGH (ref 8–23)
CO2: 28 mmol/L (ref 22–32)
Calcium: 10.3 mg/dL (ref 8.9–10.3)
Chloride: 97 mmol/L — ABNORMAL LOW (ref 98–111)
Creatinine, Ser: 1.33 mg/dL — ABNORMAL HIGH (ref 0.44–1.00)
GFR calc Af Amer: 41 mL/min — ABNORMAL LOW (ref >60)
GFR calc non Af Amer: 35 mL/min — ABNORMAL LOW (ref >60)
Glucose, Bld: 126 mg/dL — ABNORMAL HIGH (ref 70–99)
Potassium: 4.1 mmol/L (ref 3.5–5.1)
Sodium: 136 mmol/L (ref 135–145)
Total Bilirubin: 1.3 mg/dL — ABNORMAL HIGH (ref 0.3–1.2)
Total Protein: 7.3 g/dL (ref 6.5–8.1)

## 2018-10-14 LAB — URINALYSIS, ROUTINE W REFLEX MICROSCOPIC
Bilirubin Urine: NEGATIVE
Glucose, UA: NEGATIVE mg/dL
Ketones, ur: NEGATIVE mg/dL
Nitrite: NEGATIVE
Protein, ur: 30 mg/dL — AB
Specific Gravity, Urine: 1.01 (ref 1.005–1.030)
WBC, UA: >50 WBC/hpf — ABNORMAL HIGH (ref 0–5)
pH: 5 (ref 5.0–8.0)

## 2018-10-14 LAB — TROPONIN I: Troponin I: 0.03 ng/mL (ref <0.03)

## 2018-10-14 LAB — LIPASE, BLOOD: Lipase: 27 U/L (ref 11–51)

## 2018-10-14 MED ORDER — HYDROCODONE-ACETAMINOPHEN 5-325 MG PO TABS
1.0000 | ORAL_TABLET | Freq: Once | ORAL | Status: AC
  Administered 2018-10-14: 20:00:00 1 via ORAL
  Filled 2018-10-14: qty 1

## 2018-10-14 MED ORDER — ONDANSETRON 4 MG PO TBDP: 4.0000 mg | ORAL_TABLET | Freq: Once | ORAL | Status: DC

## 2018-10-14 MED ORDER — HEPARIN BOLUS VIA INFUSION
4000.0000 [IU] | Freq: Once | INTRAVENOUS | Status: AC
  Administered 2018-10-14: 4000 [IU] via INTRAVENOUS
  Filled 2018-10-14: qty 4000

## 2018-10-14 MED ORDER — HEPARIN (PORCINE) 25000 UT/250ML-% IV SOLN
900.0000 [IU]/h | INTRAVENOUS | Status: DC
  Administered 2018-10-14: 900 [IU]/h via INTRAVENOUS
  Filled 2018-10-14: qty 250

## 2018-10-14 MED ORDER — ACETAMINOPHEN 325 MG PO TABS
650.0000 mg | ORAL_TABLET | ORAL | Status: DC | PRN
  Administered 2018-10-16: 650 mg via ORAL
  Filled 2018-10-14: qty 2

## 2018-10-14 MED ORDER — ONDANSETRON HCL 4 MG/2ML IJ SOLN
4.0000 mg | Freq: Four times a day (QID) | INTRAMUSCULAR | Status: DC | PRN
  Administered 2018-10-15: 4 mg via INTRAVENOUS
  Filled 2018-10-14: qty 2

## 2018-10-14 MED ORDER — ONDANSETRON HCL 4 MG/2ML IJ SOLN: 4.0000 mg | Freq: Once | INTRAMUSCULAR | Status: DC

## 2018-10-14 MED ORDER — ALUM & MAG HYDROXIDE-SIMETH 200-200-20 MG/5ML PO SUSP
30.0000 mL | Freq: Once | ORAL | Status: AC
  Administered 2018-10-14: 30 mL via ORAL
  Filled 2018-10-14: qty 30

## 2018-10-14 NOTE — ED Notes (Signed)
Rn attempted to call report. Told floor RN would call back.

## 2018-10-14 NOTE — ED Notes (Signed)
Unsuccessful attempt made to draw labs by this RN, NT, and phlebotomy; Dr. Ralene Bathe aware; will order IV team consult per Dr. Ralene Bathe

## 2018-10-14 NOTE — H&P (Signed)
History and Physical   Christina Roberts OJJ:[id number] DOB: [birth year] DOA: 10/14/2018  Referring MD/NP/PA: Dr. Ralene Bathe  PCP: Glenford Bayley, DO   Outpatient Specialists: None  Patient coming from: Home  Chief Complaint: Chest pain  HPI: Christina Roberts is a 85 y.o. female with medical history significant of diabetes, hypertension, who presented to the ER with chest pain vomiting and epigastric pain.  Symptoms started about 2 hours prior to arrival.  He was rated as 6 out of 10.  Squeezing in nature.  Patient has had another episode of vomiting yesterday.  No other complaint.  No fever no sick contacts.  Denied any diarrhea no constipation.  Patient received aspirin from EMS on her way to the ER.  In the ER chest pain was relieved.  She had normal EKG and initial enzymes is only marginally elevated.  Due to risk factors patient is being admitted for observation on rule out MI..  ED Course: Temperature 98.3 blood pressure 187/81 pulse 81 respiratory 24 oxygen sat 96% room air.Sodium 136 glucose 126 creatinine 1.33.  Troponin 0.03.  CBC entirely within normal.  Urinalysis showed cloudy urine with large leukocytes.  Many bacteria.  WBC more than 50.  EKG showed no acute findings.  Patient being admitted for rule out MI.  Review of Systems: As per HPI otherwise 10 point review of systems negative.    Past Medical History:  Diagnosis Date  . Diabetes mellitus without complication ([place])   . Hypertension     History reviewed. No pertinent surgical history.   has no history on file for tobacco, alcohol, and drug.  Allergies  Allergen Reactions  . Fosamax [Alendronate Sodium] Swelling    Swelling of lips and nose    No family history on file.   Prior to Admission medications   Not on File    Physical Exam: Vitals:   10/14/18 2200 10/14/18 2226 10/15/18 0027 10/15/18 0055  BP:  (!) 164/84 (!) 187/81 (!) 167/70  Pulse:  81 78 79  Resp:  18 18   Temp:  98.2 F (36.8 C) 97.9 F (36.6  C)   TempSrc:  Oral Oral   SpO2:  98% 98%   Weight: 77.4 kg     Height: 5\' 6"  (1.676 m)         Constitutional: NAD, frail, acutely ill looking Vitals:   10/14/18 2200 10/14/18 2226 10/15/18 0027 10/15/18 0055  BP:  (!) 164/84 (!) 187/81 (!) 167/70  Pulse:  81 78 79  Resp:  18 18   Temp:  98.2 F (36.8 C) 97.9 F (36.6 C)   TempSrc:  Oral Oral   SpO2:  98% 98%   Weight: 77.4 kg     Height: 5\' 6"  (1.676 m)      Eyes: PERRL, lids and conjunctivae normal ENMT: Mucous membranes are moist. Posterior pharynx clear of any exudate or lesions.Normal dentition.  Neck: normal, supple, no masses, no thyromegaly Respiratory: clear to auscultation bilaterally, no wheezing, no crackles. Normal respiratory effort. No accessory muscle use.  Cardiovascular: Regular rate and rhythm, no murmurs / rubs / gallops. No extremity edema. 2+ pedal pulses. No carotid bruits.  Abdomen: no tenderness, no masses palpated. No hepatosplenomegaly. Bowel sounds positive.  Musculoskeletal: no clubbing / cyanosis. No joint deformity upper and lower extremities. Good ROM, no contractures. Normal muscle tone.  Skin: no rashes, lesions, ulcers. No induration Neurologic: CN 2-12 grossly intact. Sensation intact, DTR normal. Strength 5/5 in all 4.  Psychiatric: Normal judgment  and insight. Alert and oriented x 3. Normal mood.     Labs on Admission: I have personally reviewed following labs and imaging studies  CBC: Recent Labs  Lab 10/14/18 1840  WBC 9.6  NEUTROABS 6.4  HGB 12.4  HCT 38.7  MCV 89.4  PLT 798   Basic Metabolic Panel: Recent Labs  Lab 10/14/18 1840  NA 136  K 4.1  CL 97*  CO2 28  GLUCOSE 126*  BUN 30*  CREATININE 1.33*  CALCIUM 10.3   GFR: Estimated Creatinine Clearance: 29.5 mL/min (A) (by C-G formula based on SCr of 1.33 mg/dL (H)). Liver Function Tests: Recent Labs  Lab 10/14/18 1840  AST 22  ALT 16  ALKPHOS 64  BILITOT 1.3*  PROT 7.3  ALBUMIN 4.0   Recent Labs   Lab 10/14/18 1840  LIPASE 27   No results for input(s): AMMONIA in the last 168 hours. Coagulation Profile: No results for input(s): INR, PROTIME in the last 168 hours. Cardiac Enzymes: Recent Labs  Lab 10/14/18 1840 10/14/18 2308 10/15/18 0118  TROPONINI 0.03* 0.03* 0.04*   BNP (last 3 results) No results for input(s): PROBNP in the last 8760 hours. HbA1C: No results for input(s): HGBA1C in the last 72 hours. CBG: No results for input(s): GLUCAP in the last 168 hours. Lipid Profile: No results for input(s): CHOL, HDL, LDLCALC, TRIG, CHOLHDL, LDLDIRECT in the last 72 hours. Thyroid Function Tests: No results for input(s): TSH, T4TOTAL, FREET4, T3FREE, THYROIDAB in the last 72 hours. Anemia Panel: No results for input(s): VITAMINB12, FOLATE, FERRITIN, TIBC, IRON, RETICCTPCT in the last 72 hours. Urine analysis:    Component Value Date/Time   COLORURINE YELLOW 10/14/2018 2015   APPEARANCEUR CLOUDY (A) 10/14/2018 2015   LABSPEC 1.010 10/14/2018 2015   PHURINE 5.0 10/14/2018 2015   GLUCOSEU NEGATIVE 10/14/2018 2015   HGBUR SMALL (A) 10/14/2018 2015   [place] NEGATIVE 10/14/2018 2015   [place] 10/14/2018 2015   PROTEINUR 30 (A) 10/14/2018 2015   NITRITE NEGATIVE 10/14/2018 2015   LEUKOCYTESUR LARGE (A) 10/14/2018 2015   Sepsis Labs: @LABRCNTIP (procalcitonin:4,lacticidven:4) )No results found for this or any previous visit (from the past 240 hour(s)).   Radiological Exams on Admission: Ct Abdomen Pelvis Wo Contrast  Result Date: 10/14/2018 CLINICAL DATA:  Nausea and vomiting EXAM: CT ABDOMEN AND PELVIS WITHOUT CONTRAST TECHNIQUE: Multidetector CT imaging of the abdomen and pelvis was performed following the standard protocol without IV contrast. COMPARISON:  None. FINDINGS: Lower chest: Lung bases demonstrate no acute consolidation or effusion. The heart size is within normal limits. Dense mitral calcification. Moderate hiatal hernia. Hepatobiliary: No focal  liver abnormality is seen. Status post cholecystectomy. No biliary dilatation. Pancreas: Unremarkable. No pancreatic ductal dilatation or surrounding inflammatory changes. Spleen: Normal in size without focal abnormality. Adrenals/Urinary Tract: Adrenal glands are within normal limits. Mildly atrophic kidneys. No hydronephrosis. Probable cyst lower pole left kidney. Bladder is unremarkable Stomach/Bowel: Stomach is nonenlarged. No dilated small bowel. No colon wall thickening. Diverticular disease of the colon without acute inflammatory change. Appendix not well seen but no right lower quadrant inflammation. Vascular/Lymphatic: Moderate aortic atherosclerosis without aneurysm. No significantly enlarged lymph nodes Reproductive: Status post hysterectomy. No adnexal masses. Other: Negative for free air or free fluid. Previous ventral hernia repair. Small moderate supraumbilical fat containing ventral hernia. Musculoskeletal: Chronic compression fracture L1. Degenerative changes without acute osseous abnormality IMPRESSION: 1. No CT evidence for acute intracranial abnormality. 2. Moderate hiatal hernia 3. Colon diverticular disease without acute inflammatory process 4. Fat  containing supraumbilical ventral hernia 5. Chronic compression fracture L1 Electronically Signed   By: Donavan Foil M.D.   On: 10/14/2018 20:14   Dg Chest Port 1 View  Result Date: 10/14/2018 CLINICAL DATA:  Chest pain EXAM: PORTABLE CHEST 1 VIEW COMPARISON:  None. FINDINGS: Post sternotomy changes. No focal consolidation or effusion. Normal heart size. Aortic atherosclerosis. No pneumothorax. IMPRESSION: No active disease. Electronically Signed   By: Donavan Foil M.D.   On: 10/14/2018 16:14    EKG: Independently reviewed.  It shows sinus rhythm with a rate of 70, evidence of right bundle branch block.  Mild ST depressions in the lateral leads visible Q waves.  No prior EKG to compare.  Assessment/Plan Principal Problem:   Chest pain  Active Problems:   UTI (urinary tract infection)   Diabetes (HCC)   Essential hypertension     #1 chest pain: Patient has risk factors for coronary artery disease.  We will admit for observation.  Check serial enzymes x3.  Echocardiogram.  May need to get his cardiac stress testing due to abnormal EKG.  Continue nitroglycerin, heparin as well as supportive care on telemetry.  #2 diabetes: Sliding scale insulin.  Resume home regimen.  #3 UTI: Patient is not a good historian.  Not complain of dysuria.  She is afebrile pulse urinalysis is overwhelming.  It could still be asymptomatic bacteriuria but we will empirically treat with Rocephin while getting urine cultures.  #4 essential hypertension: Continue home regimen and monitor closely.     DVT prophylaxis: Heparin Code Status: Full code Family Communication: Daughters over the phone Disposition Plan: Home Consults called: None Admission status: Observation  Severity of Illness: The appropriate patient status for this patient is OBSERVATION. Observation status is judged to be reasonable and necessary in order to provide the required intensity of service to ensure the patient's safety. The patient's presenting symptoms, physical exam findings, and initial radiographic and laboratory data in the context of their medical condition is felt to place them at decreased risk for further clinical deterioration. Furthermore, it is anticipated that the patient will be medically stable for discharge from the hospital within 2 midnights of admission. The following factors support the patient status of observation.   " The patient's presenting symptoms include chest pain. " The physical exam findings include no obvious findings except for age debilitation. " The initial radiographic and laboratory data are mildly elevated troponin.     Barbette Merino MD [facility] Pager [phone]  If 7PM-7AM, please contact night-coverage  [email or web address] Password [facility]  10/15/2018, 3:36 AM

## 2018-10-14 NOTE — ED Notes (Signed)
IV team at bedside 

## 2018-10-14 NOTE — ED Provider Notes (Signed)
Care assumed from Dr. Marin Roberts.  Please see her full H&P.  In short,  Christina Roberts is a 85 y.o. female presents for pain and vomiting.  Patient chest pain-free on arrival here in the emergency department.  She describes her pain as squeezing across her lower rib cage.    Physical Exam  BP (!) 149/115   Pulse 79   Temp 98.3 F (36.8 C) (Oral)   Resp 15   Ht 5\' 6"  (1.676 m)   Wt 77.1 kg   SpO2 96%   BMI 27.44 kg/m   Physical Exam Vitals signs and nursing note reviewed.  Constitutional:      General: She is not in acute distress.    Appearance: She is well-developed.  HENT:     Head: Normocephalic.  Eyes:     General: No scleral icterus.    Conjunctiva/sclera: Conjunctivae normal.  Neck:     Musculoskeletal: Normal range of motion.  Cardiovascular:     Rate and Rhythm: Normal rate.  Pulmonary:     Effort: Pulmonary effort is normal.  Musculoskeletal: Normal range of motion.  Skin:    General: Skin is warm and dry.  Neurological:     Mental Status: She is alert.     ED Course/Procedures   Clinical Course as of Oct 13 2205  Sat Oct 14, 2018  2000 Plan: With elevated troponin.  Will need admission for pain rule out due to age and risk factors.  CT scan pending to rule out abdominal obstruction or intra-abdominal pathology.   [HM]  2020 Pt is chest pain free at this time.   [HM]  2033 Discussed with Dr. Jonelle Sidle who will admit   [HM]    Clinical Course User Index [HM] Lorenza Winkleman, Gwenlyn Perking    Procedures  MDM   Patient's EKG was nonischemic and initial labs reassuring however troponin did return elevated.  Patient has a history of hypertension and diabetes.  With associated vomiting, concern for cardiac etiology.  The scan of her abdomen is without acute abnormality.  On repeat exam, her abdomen is soft and nontender.  No additional episodes of emesis and patient is requesting to eat.  Gust with [facility] who will admit.     Central chest  pain   Elevated troponin I level    Emelee Rodocker, Gwenlyn Perking 10/14/18 2208    Quintella Reichert, MD 10/15/18 1056

## 2018-10-14 NOTE — ED Notes (Signed)
I tried to get blood on pt

## 2018-10-14 NOTE — ED Notes (Signed)
ED TO INPATIENT HANDOFF REPORT  ED Nurse Name and Phone #: [phone] Threasa Beards, RN  S Name/Age/Gender Christina Roberts 85 y.o. female Room/Bed: 042C/042C  Code Status   Code Status: Not on file  Home/SNF/Other Home Patient oriented to: self, place, time and situation Is this baseline? Yes   Triage Complete: Triage complete  Chief Complaint sick  Triage Note Pt here via EMS for chest pain x 2 hours from home with daughter. States it hurts worse with palpation. EKG RBB, 5/10 pain central chest, gave ASA states pain is resolved following. EMS states pain seemed anxiety related. 179/94 BP, 70 HR, CBG 160   Allergies Allergies  Allergen Reactions  . Fosamax [Alendronate Sodium] Swelling    Swelling of lips and nose    Level of Care/Admitting Diagnosis ED Disposition    ED Disposition Condition [facility] Area: [place] [100100]  Level of Care: Telemetry Cardiac [103]  I expect the patient will be discharged within 24 hours: Yes  LOW acuity---Tx typically complete <24 hrs---ACUTE conditions typically can be evaluated <24 hours---LABS likely to return to acceptable levels <24 hours---IS near functional baseline---EXPECTED to return to current living arrangement---NOT newly hypoxic: Meets criteria for 5C-Observation unit  Covid Evaluation: N/A  Diagnosis: Chest pain [814481]  Admitting Physician: Elwyn Reach [[id number]]  Attending Physician: Elwyn Reach [[id number]]  PT Class (Do Not Modify): Observation [104]  PT Acc Code (Do Not Modify): Observation [10022]       B Medical/Surgery History Past Medical History:  Diagnosis Date  . Diabetes mellitus without complication ([place])   . Hypertension    History reviewed. No pertinent surgical history.   A IV Location/Drains/Wounds Patient Lines/Drains/Airways Status   Active Line/Drains/Airways    Name:   Placement date:   Placement time:   Site:   Days:   Peripheral IV 10/14/18  Right;Anterior Forearm   10/14/18    1846    Forearm   less than 1          Intake/Output Last 24 hours No intake or output data in the 24 hours ending 10/14/18 2110  Labs/Imaging Results for orders placed or performed during the hospital encounter of 10/14/18 (from the past 48 hour(s))  Comprehensive metabolic panel     Status: Abnormal   Collection Time: 10/14/18  6:40 PM  Result Value Ref Range   Sodium 136 135 - 145 mmol/L   Potassium 4.1 3.5 - 5.1 mmol/L   Chloride 97 (L) 98 - 111 mmol/L   CO2 28 22 - 32 mmol/L   Glucose, Bld 126 (H) 70 - 99 mg/dL   BUN 30 (H) 8 - 23 mg/dL   Creatinine, Ser 1.33 (H) 0.44 - 1.00 mg/dL   Calcium 10.3 8.9 - 10.3 mg/dL   Total Protein 7.3 6.5 - 8.1 g/dL   Albumin 4.0 3.5 - 5.0 g/dL   AST 22 15 - 41 U/L   ALT 16 0 - 44 U/L   Alkaline Phosphatase 64 38 - 126 U/L   Total Bilirubin 1.3 (H) 0.3 - 1.2 mg/dL   GFR calc non Af Amer 35 (L) >60 mL/min   GFR calc Af Amer 41 (L) >60 mL/min   Anion gap 11 5 - 15    Comment: Performed at [facility], [address] [place], [place] [address]  CBC with Differential     Status: None   Collection Time: 10/14/18  6:40 PM  Result Value Ref Range  WBC 9.6 4.0 - 10.5 K/uL   RBC 4.33 3.87 - 5.11 MIL/uL   Hemoglobin 12.4 12.0 - 15.0 g/dL   HCT 38.7 36.0 - 46.0 %   MCV 89.4 80.0 - 100.0 fL   MCH 28.6 26.0 - 34.0 pg   MCHC 32.0 30.0 - 36.0 g/dL   RDW 11.9 11.5 - 15.5 %   Platelets 263 150 - 400 K/uL   nRBC 0.0 0.0 - 0.2 %   Neutrophils Relative % 68 %   Neutro Abs 6.4 1.7 - 7.7 K/uL   Lymphocytes Relative 24 %   Lymphs Abs 2.3 0.7 - 4.0 K/uL   Monocytes Relative 7 %   Monocytes Absolute 0.7 0.1 - 1.0 K/uL   Eosinophils Relative 1 %   Eosinophils Absolute 0.1 0.0 - 0.5 K/uL   Basophils Relative 0 %   Basophils Absolute 0.0 0.0 - 0.1 K/uL   Immature Granulocytes 0 %   Abs Immature Granulocytes 0.03 0.00 - 0.07 K/uL    Comment: Performed at [facility] [address] [place],  [place] [address]  Lipase, blood     Status: None   Collection Time: 10/14/18  6:40 PM  Result Value Ref Range   Lipase 27 11 - 51 U/L    Comment: Performed at [facility], [place] [address] [place], [place] [address]  Troponin I - Once     Status: Abnormal   Collection Time: 10/14/18  6:40 PM  Result Value Ref Range   Troponin I 0.03 (HH) <0.03 ng/mL    Comment: CRITICAL RESULT CALLED TO, READ BACK BY AND VERIFIED WITH: Delorise Jackson RN AT 2878 10/14/2018 BY [facility] Performed at [facility], [place] [address] [place], [place] [address]   Urinalysis, Routine w reflex microscopic     Status: Abnormal   Collection Time: 10/14/18  8:15 PM  Result Value Ref Range   Color, Urine YELLOW YELLOW   APPearance CLOUDY (A) CLEAR   Specific Gravity, Urine 1.010 1.005 - 1.030   pH 5.0 5.0 - 8.0   Glucose, UA NEGATIVE NEGATIVE mg/dL   Hgb urine dipstick SMALL (A) NEGATIVE   Bilirubin Urine NEGATIVE NEGATIVE   Ketones, ur NEGATIVE NEGATIVE mg/dL   Protein, ur 30 (A) NEGATIVE mg/dL   Nitrite NEGATIVE NEGATIVE   Leukocytes,Ua LARGE (A) NEGATIVE   RBC / HPF 6-10 0 - 5 RBC/hpf   WBC, UA >50 (H) 0 - 5 WBC/hpf   Bacteria, UA MANY (A) NONE SEEN   Squamous Epithelial / LPF 0-5 0 - 5   WBC Clumps PRESENT    Mucus PRESENT     Comment: Performed at [facility], [place] [address], [place], [place] [address]   Ct Abdomen Pelvis Wo Contrast  Result Date: 10/14/2018 CLINICAL DATA:  Nausea and vomiting EXAM: CT ABDOMEN AND PELVIS WITHOUT CONTRAST TECHNIQUE: Multidetector CT imaging of the abdomen and pelvis was performed following the standard protocol without IV contrast. COMPARISON:  None. FINDINGS: Lower chest: Lung bases demonstrate no acute consolidation or effusion. The heart size is within normal limits. Dense mitral calcification. Moderate hiatal hernia. Hepatobiliary: No focal liver abnormality is seen. Status post cholecystectomy. No biliary dilatation. Pancreas: Unremarkable. No pancreatic  ductal dilatation or surrounding inflammatory changes. Spleen: Normal in size without focal abnormality. Adrenals/Urinary Tract: Adrenal glands are within normal limits. Mildly atrophic kidneys. No hydronephrosis. Probable cyst lower pole left kidney. Bladder is unremarkable Stomach/Bowel: Stomach is nonenlarged. No dilated small bowel. No  colon wall thickening. Diverticular disease of the colon without acute inflammatory change. Appendix not well seen but no right lower quadrant inflammation. Vascular/Lymphatic: Moderate aortic atherosclerosis without aneurysm. No significantly enlarged lymph nodes Reproductive: Status post hysterectomy. No adnexal masses. Other: Negative for free air or free fluid. Previous ventral hernia repair. Small moderate supraumbilical fat containing ventral hernia. Musculoskeletal: Chronic compression fracture L1. Degenerative changes without acute osseous abnormality IMPRESSION: 1. No CT evidence for acute intracranial abnormality. 2. Moderate hiatal hernia 3. Colon diverticular disease without acute inflammatory process 4. Fat containing supraumbilical ventral hernia 5. Chronic compression fracture L1 Electronically Signed   By: Donavan Foil M.D.   On: 10/14/2018 20:14   Dg Chest Port 1 View  Result Date: 10/14/2018 CLINICAL DATA:  Chest pain EXAM: PORTABLE CHEST 1 VIEW COMPARISON:  None. FINDINGS: Post sternotomy changes. No focal consolidation or effusion. Normal heart size. Aortic atherosclerosis. No pneumothorax. IMPRESSION: No active disease. Electronically Signed   By: Donavan Foil M.D.   On: 10/14/2018 16:14    Pending Labs Unresulted Labs (From admission, onward)    Start     Ordered   10/14/18 2025  Novel Coronavirus,NAA,(SEND-OUT TO REF LAB - TAT 24-48 hrs); Hosp Order  (Asymptomatic Patients Labs)  Once,   STAT    Question:  Rule Out  Answer:  Yes   10/14/18 2024   10/14/18 1924  Urine culture  ONCE - STAT,   STAT     10/14/18 1923   Signed and Held  Troponin  I - Now Then Q3H  Now then every 3 hours,   TIMED    Question:  Specimen collection method  Answer:  IV Team=IV Team collect   Signed and Held          Vitals/Pain Today's Vitals   10/14/18 1800 10/14/18 1847 10/14/18 1912 10/14/18 2003  BP: (!) 165/91  (!) 149/115   Pulse: 79  79   Resp: (!) 24  15   Temp:      TempSrc:      SpO2: 97%  96%   Weight:      Height:      PainSc:  6   5     Isolation Precautions No active isolations  Medications Medications  ondansetron (ZOFRAN-ODT) disintegrating tablet 4 mg (0 mg Oral Hold 10/14/18 1604)  alum & mag hydroxide-simeth (MAALOX/MYLANTA) 200-200-20 MG/5ML suspension 30 mL (30 mLs Oral Given 10/14/18 1931)  HYDROcodone-acetaminophen (NORCO/VICODIN) 5-325 MG per tablet 1 tablet (1 tablet Oral Given 10/14/18 2004)    Mobility walks with device Low fall risk   Focused Assessments Cardiac Assessment Handoff:  Cardiac Rhythm: Normal sinus rhythm Lab Results  Component Value Date   TROPONINI 0.03 ([place]) 10/14/2018   No results found for: DDIMER Does the Patient currently have chest pain? No     R Recommendations: See Admitting Provider Note  Report given to:   Additional Notes:

## 2018-10-14 NOTE — Progress Notes (Addendum)
ANTICOAGULATION CONSULT NOTE - Initial Consult  Pharmacy Consult for heparin Indication: chest pain/ACS  No Known Allergies  Patient Measurements: Height: 5\' 6"  (167.6 cm) Weight: 170 lb (77.1 kg) IBW/kg (Calculated) : 59.3 Heparin Dosing Weight: 75 kg  Vital Signs: Temp: 98.3 F (36.8 C) (06/20 1512) Temp Source: Oral (06/20 1512) BP: 149/115 (06/20 1912) Pulse Rate: 79 (06/20 1912)  Labs: Recent Labs    10/14/18 1840  HGB 12.4  HCT 38.7  PLT 263  CREATININE 1.33*  TROPONINI 0.03*    Estimated Creatinine Clearance: 29.5 mL/min (A) (by C-G formula based on SCr of 1.33 mg/dL (H)).   Medical History: Past Medical History:  Diagnosis Date  . Diabetes mellitus without complication ([place])   . Hypertension     Medications:  Scheduled:  . ondansetron  4 mg Oral Once    Assessment: 70 yof presenting with chest pain - no AC PTA.  Hgb 12.4, plt 263. Trop 0.03. No s/sx of bleeding.   Goal of Therapy:  Heparin level 0.3-0.7 units/ml Monitor platelets by anticoagulation protocol: Yes   Plan:  Give 4000 units bolus x 1 Start heparin infusion at 900 units/hr Check anti-Xa level in 8 hours and daily while on heparin Continue to monitor H&H and platelets  Antonietta Jewel, PharmD, [place] Clinical Pharmacist  Pager: [phone] Phone: [phone] 10/14/2018,8:45 PM

## 2018-10-14 NOTE — ED Provider Notes (Addendum)
[facility] EMERGENCY DEPARTMENT Provider Note   CSN: [id number] Arrival date & time: 10/14/18  1446    History   Chief Complaint Chief Complaint  Patient presents with  . Chest Pain    HPI Christina Roberts is a 85 y.o. female.     The history is provided by the patient, medical records, the EMS personnel and a relative. No language interpreter was used.  Chest Pain  Christina Roberts is a 85 y.o. female who presents to the Emergency Department complaining of vomiting, chest pain. She presents to the emergency department by EMS from home for evaluation of chest pain that began about two hours prior to ED arrival. She describes it as a squeezing type sensation across her lower rib cage. She had an episode of vomiting yesterday as well as an additional episode of vomiting today. She also complains of nausea and abdominal discomfort. She denies any fevers, cough, shortness of breath, diarrhea, dysuria. She has chronic urinary and bowel incontinence, unchanged from baseline. She also complains of squeezing pain in both her legs. No known coronavirus exposures. Symptoms are moderate and constant nature. EMS reports that she was given an aspirin prior to ED arrival and at that time she had resolution of her chest pain. On assessment at the bedside she does complain of some mild ongoing chest discomfort, nausea. Past Medical History:  Diagnosis Date  . Diabetes mellitus without complication ([place])   . Hypertension     There are no active problems to display for this patient.   History reviewed. No pertinent surgical history.   OB History   No obstetric history on file.      Home Medications    Prior to Admission medications   Not on File    Family History No family history on file.  Social History Social History   Tobacco Use  . Smoking status: Not on file  Substance Use Topics  . Alcohol use: Not on file  . Drug use: Not on file     Allergies    Patient has no known allergies.   Review of Systems Review of Systems  Cardiovascular: Positive for chest pain.  All other systems reviewed and are negative.    Physical Exam Updated Vital Signs BP (!) 165/91   Pulse 79   Temp 98.3 F (36.8 C) (Oral)   Resp (!) 24   Ht 5\' 6"  (1.676 m)   Wt 77.1 kg   SpO2 97%   BMI 27.44 kg/m   Physical Exam Vitals signs and nursing note reviewed.  Constitutional:      Appearance: She is well-developed.  HENT:     Head: Normocephalic and atraumatic.  Cardiovascular:     Rate and Rhythm: Normal rate and regular rhythm.     Heart sounds: No murmur.  Pulmonary:     Effort: Pulmonary effort is normal. No respiratory distress.     Breath sounds: Normal breath sounds.  Abdominal:     Palpations: Abdomen is soft.     Tenderness: There is no abdominal tenderness. There is no guarding or rebound.  Musculoskeletal:        General: No swelling or tenderness.     Comments: 2+ DP pulses bilaterally  Skin:    General: Skin is warm and dry.  Neurological:     Mental Status: She is alert and oriented to person, place, and time.  Psychiatric:        Behavior: Behavior normal.  Comments: Anxious appearing      ED Treatments / Results  Labs (all labs ordered are listed, but only abnormal results are displayed) Labs Reviewed  COMPREHENSIVE METABOLIC PANEL - Abnormal; Notable for the following components:      Result Value   Chloride 97 (*)    Glucose, Bld 126 (*)    BUN 30 (*)    Creatinine, Ser 1.33 (*)    Total Bilirubin 1.3 (*)    GFR calc non Af Amer 35 (*)    GFR calc Af Amer 41 (*)    All other components within normal limits  TROPONIN I - Abnormal; Notable for the following components:   Troponin I 0.03 (*)    All other components within normal limits  URINE CULTURE  CBC WITH DIFFERENTIAL/PLATELET  LIPASE, BLOOD  URINALYSIS, ROUTINE W REFLEX MICROSCOPIC    EKG EKG Interpretation  Date/Time:  Saturday October 14 2018  15:10:18 EDT Ventricular Rate:  70 PR Interval:    QRS Duration: 143 QT Interval:  436 QTC Calculation: 471 R Axis:   50 Text Interpretation:  Sinus rhythm Right bundle branch block Abnormal inferior Q waves Baseline wander in lead(s) II III aVF no prior available for comparison Confirmed by Quintella Reichert [id number]) on 10/14/2018 3:22:00 PM   Radiology Dg Chest Port 1 View  Result Date: 10/14/2018 CLINICAL DATA:  Chest pain EXAM: PORTABLE CHEST 1 VIEW COMPARISON:  None. FINDINGS: Post sternotomy changes. No focal consolidation or effusion. Normal heart size. Aortic atherosclerosis. No pneumothorax. IMPRESSION: No active disease. Electronically Signed   By: Donavan Foil M.D.   On: 10/14/2018 16:14    Procedures Procedures (including critical care time)  Medications Ordered in ED Medications  ondansetron (ZOFRAN-ODT) disintegrating tablet 4 mg (0 mg Oral Hold 10/14/18 1604)  HYDROcodone-acetaminophen (NORCO/VICODIN) 5-325 MG per tablet 1 tablet (has no administration in time range)  alum & mag hydroxide-simeth (MAALOX/MYLANTA) 200-200-20 MG/5ML suspension 30 mL (30 mLs Oral Given 10/14/18 1931)     Initial Impression / Assessment and Plan / ED Course  I have reviewed the triage vital signs and the nursing notes.  Pertinent labs & imaging results that were available during my care of the patient were reviewed by me and considered in my medical decision making (see chart for details).  Clinical Course as of Oct 14 1052  Sat Oct 14, 2018  2000 Plan: With elevated troponin.  Will need admission for pain rule out due to age and risk factors.  CT scan pending to rule out abdominal obstruction or intra-abdominal pathology.   [HM]  2020 Pt is chest pain free at this time.   [HM]  2033 Discussed with Dr. Jonelle Sidle who will admit   [HM]    Clinical Course User Index [HM] Muthersbaugh, Jarrett Soho, PA-C      Additional hx available from daughter after initial ED arrival. Daughter reports  increased weakness lately as well as increased crying.   Home meds are lipitor 20mg  daily, paxil 20mg  daily, lasix 40mg  daily, metformin 500mg  qhs, ramipril 10mg  daily, metoprolol 12.5 mg daily, hydrocodone 1 tab q6h - increased in the last month from TID, gabapentin 100mg  takes some times, unasom, occasional aleve, ASA 81mg , D3, MVI.    Patient with history of hypertension, hyperlipidemia, diabetes, valvular replacement, chronic pain here from home for evaluation of chest tightness, nausea, vomiting. She is anxious appearing on evaluation but non-toxic appearing EKG without acute ischemic changes, no priors available for comparison. Plan to check labs, CT abdomen  pelvis to rule out obstruction. She does have a history of chronic recurrent UTIs and has poor tolerance antibiotics due to diarrhea. Will check urinalysis and only treat if clinical picture is consistent with symptomatic UTI. Patient care transferred pending imaging and labs.  Final Clinical Impressions(s) / ED Diagnoses   Final diagnoses:  None    ED Discharge Orders    None       Quintella Reichert, MD 10/14/18 1949    Quintella Reichert, MD 10/15/18 1055

## 2018-10-14 NOTE — ED Triage Notes (Signed)
Pt here via EMS for chest pain x 2 hours from home with daughter. States it hurts worse with palpation. EKG RBB, 5/10 pain central chest, gave ASA states pain is resolved following. EMS states pain seemed anxiety related. 179/94 BP, 70 HR, CBG 160

## 2018-10-15 DIAGNOSIS — Z79899 Other long term (current) drug therapy: Secondary | ICD-10-CM | POA: Diagnosis not present

## 2018-10-15 DIAGNOSIS — R112 Nausea with vomiting, unspecified: Secondary | ICD-10-CM

## 2018-10-15 DIAGNOSIS — N390 Urinary tract infection, site not specified: Secondary | ICD-10-CM | POA: Diagnosis not present

## 2018-10-15 DIAGNOSIS — G8929 Other chronic pain: Secondary | ICD-10-CM | POA: Diagnosis present

## 2018-10-15 DIAGNOSIS — M4856XA Collapsed vertebra, not elsewhere classified, lumbar region, initial encounter for fracture: Secondary | ICD-10-CM | POA: Diagnosis not present

## 2018-10-15 DIAGNOSIS — Z7984 Long term (current) use of oral hypoglycemic drugs: Secondary | ICD-10-CM | POA: Diagnosis not present

## 2018-10-15 DIAGNOSIS — K529 Noninfective gastroenteritis and colitis, unspecified: Secondary | ICD-10-CM | POA: Diagnosis present

## 2018-10-15 DIAGNOSIS — R0789 Other chest pain: Secondary | ICD-10-CM | POA: Diagnosis not present

## 2018-10-15 DIAGNOSIS — Z7982 Long term (current) use of aspirin: Secondary | ICD-10-CM | POA: Diagnosis not present

## 2018-10-15 DIAGNOSIS — Z79891 Long term (current) use of opiate analgesic: Secondary | ICD-10-CM | POA: Diagnosis not present

## 2018-10-15 DIAGNOSIS — I10 Essential (primary) hypertension: Secondary | ICD-10-CM | POA: Diagnosis present

## 2018-10-15 DIAGNOSIS — E119 Type 2 diabetes mellitus without complications: Secondary | ICD-10-CM | POA: Diagnosis not present

## 2018-10-15 DIAGNOSIS — R079 Chest pain, unspecified: Secondary | ICD-10-CM | POA: Diagnosis not present

## 2018-10-15 DIAGNOSIS — K449 Diaphragmatic hernia without obstruction or gangrene: Secondary | ICD-10-CM | POA: Diagnosis present

## 2018-10-15 DIAGNOSIS — K2970 Gastritis, unspecified, without bleeding: Secondary | ICD-10-CM | POA: Diagnosis present

## 2018-10-15 DIAGNOSIS — K5790 Diverticulosis of intestine, part unspecified, without perforation or abscess without bleeding: Secondary | ICD-10-CM | POA: Diagnosis present

## 2018-10-15 DIAGNOSIS — K439 Ventral hernia without obstruction or gangrene: Secondary | ICD-10-CM | POA: Diagnosis present

## 2018-10-15 DIAGNOSIS — N3000 Acute cystitis without hematuria: Secondary | ICD-10-CM | POA: Diagnosis not present

## 2018-10-15 DIAGNOSIS — E785 Hyperlipidemia, unspecified: Secondary | ICD-10-CM | POA: Diagnosis present

## 2018-10-15 DIAGNOSIS — Z1159 Encounter for screening for other viral diseases: Secondary | ICD-10-CM | POA: Diagnosis not present

## 2018-10-15 DIAGNOSIS — I4510 Unspecified right bundle-branch block: Secondary | ICD-10-CM | POA: Diagnosis present

## 2018-10-15 LAB — GLUCOSE, CAPILLARY
Glucose-Capillary: 122 mg/dL — ABNORMAL HIGH (ref 70–99)
Glucose-Capillary: 149 mg/dL — ABNORMAL HIGH (ref 70–99)
Glucose-Capillary: 108 mg/dL — ABNORMAL HIGH (ref 70–99)
Glucose-Capillary: 93 mg/dL (ref 70–99)

## 2018-10-15 LAB — TROPONIN I
Troponin I: 0.03 ng/mL (ref <0.03)
Troponin I: 0.04 ng/mL (ref <0.03)
Troponin I: 0.03 ng/mL (ref <0.03)

## 2018-10-15 MED ORDER — METOPROLOL SUCCINATE ER 25 MG PO TB24
12.5000 mg | ORAL_TABLET | Freq: Every day | ORAL | Status: DC
  Administered 2018-10-16: 12.5 mg via ORAL
  Filled 2018-10-15: qty 1

## 2018-10-15 MED ORDER — SODIUM CHLORIDE 0.9 % IV SOLN
1.0000 g | Freq: Every day | INTRAVENOUS | Status: DC
  Administered 2018-10-15 – 2018-10-16 (×2): 1 g via INTRAVENOUS
  Filled 2018-10-15 (×2): qty 10

## 2018-10-15 MED ORDER — PROMETHAZINE HCL 25 MG/ML IJ SOLN
6.2500 mg | Freq: Four times a day (QID) | INTRAMUSCULAR | Status: DC | PRN
  Administered 2018-10-15: 6.25 mg via INTRAVENOUS
  Filled 2018-10-15: qty 1

## 2018-10-15 MED ORDER — NITROGLYCERIN 0.4 MG SL SUBL
SUBLINGUAL_TABLET | SUBLINGUAL | Status: AC
  Filled 2018-10-15: qty 1

## 2018-10-15 MED ORDER — NAPROXEN 250 MG PO TABS: 250.0000 mg | ORAL_TABLET | Freq: Two times a day (BID) | ORAL | Status: DC | PRN

## 2018-10-15 MED ORDER — ENOXAPARIN SODIUM 40 MG/0.4ML ~~LOC~~ SOLN
40.0000 mg | SUBCUTANEOUS | Status: DC
  Administered 2018-10-15 – 2018-10-16 (×2): 40 mg via SUBCUTANEOUS
  Filled 2018-10-15 (×2): qty 0.4

## 2018-10-15 MED ORDER — SODIUM CHLORIDE 0.9 % IV SOLN
INTRAVENOUS | Status: DC
  Administered 2018-10-15: 50 mL/h via INTRAVENOUS

## 2018-10-15 MED ORDER — ASPIRIN 81 MG PO CHEW
81.0000 mg | CHEWABLE_TABLET | Freq: Every day | ORAL | Status: DC
  Administered 2018-10-16: 81 mg via ORAL
  Filled 2018-10-15: qty 1

## 2018-10-15 MED ORDER — CAPSAICIN 0.025 % EX CREA
TOPICAL_CREAM | Freq: Two times a day (BID) | CUTANEOUS | Status: DC
  Administered 2018-10-15 – 2018-10-16 (×2): via TOPICAL
  Filled 2018-10-15 (×2): qty 60

## 2018-10-15 MED ORDER — DICLOFENAC SODIUM 1 % TD GEL
2.0000 g | Freq: Four times a day (QID) | TRANSDERMAL | Status: DC
  Administered 2018-10-15 (×3): 2 g via TOPICAL
  Filled 2018-10-15 (×2): qty 100

## 2018-10-15 MED ORDER — ADULT MULTIVITAMIN W/MINERALS CH
1.0000 | ORAL_TABLET | Freq: Every day | ORAL | Status: DC
  Administered 2018-10-16: 1 via ORAL
  Filled 2018-10-15: qty 1

## 2018-10-15 MED ORDER — HYDROCODONE-ACETAMINOPHEN 5-325 MG PO TABS
1.0000 | ORAL_TABLET | Freq: Four times a day (QID) | ORAL | Status: DC | PRN
  Administered 2018-10-15 (×2): 1 via ORAL
  Filled 2018-10-15 (×2): qty 1

## 2018-10-15 MED ORDER — MORPHINE SULFATE (PF) 2 MG/ML IV SOLN
2.0000 mg | Freq: Once | INTRAVENOUS | Status: AC
  Administered 2018-10-15: 2 mg via INTRAVENOUS
  Filled 2018-10-15: qty 1

## 2018-10-15 MED ORDER — FUROSEMIDE 40 MG PO TABS: 40.0000 mg | ORAL_TABLET | Freq: Every day | ORAL | Status: DC

## 2018-10-15 MED ORDER — ATORVASTATIN CALCIUM 10 MG PO TABS
20.0000 mg | ORAL_TABLET | Freq: Every day | ORAL | Status: DC
  Administered 2018-10-16: 20 mg via ORAL
  Filled 2018-10-15: qty 2

## 2018-10-15 MED ORDER — RAMIPRIL 2.5 MG PO CAPS
10.0000 mg | ORAL_CAPSULE | Freq: Every day | ORAL | Status: DC
  Administered 2018-10-16: 10 mg via ORAL
  Filled 2018-10-15: qty 4

## 2018-10-15 MED ORDER — VITAMIN D 25 MCG (1000 UNIT) PO TABS
1000.0000 [IU] | ORAL_TABLET | Freq: Every day | ORAL | Status: DC
  Administered 2018-10-16: 1000 [IU] via ORAL
  Filled 2018-10-15: qty 1

## 2018-10-15 MED ORDER — INSULIN ASPART 100 UNIT/ML ~~LOC~~ SOLN: 0.0000 [IU] | Freq: Three times a day (TID) | SUBCUTANEOUS | Status: DC

## 2018-10-15 MED ORDER — PANTOPRAZOLE SODIUM 40 MG IV SOLR
40.0000 mg | Freq: Two times a day (BID) | INTRAVENOUS | Status: DC
  Administered 2018-10-15 (×2): 40 mg via INTRAVENOUS
  Filled 2018-10-15 (×2): qty 40

## 2018-10-15 MED ORDER — INSULIN ASPART 100 UNIT/ML ~~LOC~~ SOLN: 0.0000 [IU] | Freq: Every day | SUBCUTANEOUS | Status: DC

## 2018-10-15 MED ORDER — PAROXETINE HCL 20 MG PO TABS
20.0000 mg | ORAL_TABLET | Freq: Every day | ORAL | Status: DC
  Administered 2018-10-16: 20 mg via ORAL
  Filled 2018-10-15: qty 1

## 2018-10-15 MED ORDER — RISAQUAD PO CAPS
1.0000 | ORAL_CAPSULE | Freq: Every day | ORAL | Status: DC
  Administered 2018-10-16: 1 via ORAL
  Filled 2018-10-15: qty 1

## 2018-10-15 MED ORDER — GABAPENTIN 100 MG PO CAPS
100.0000 mg | ORAL_CAPSULE | Freq: Every day | ORAL | Status: DC
  Filled 2018-10-15: qty 2

## 2018-10-15 NOTE — Progress Notes (Signed)
Progress Note    Christina Roberts  ZMO:[id number] DOB: 1926/07/01  DOA: 10/14/2018 PCP: Glenford Bayley, DO    Brief Narrative:    Medical records reviewed and are as summarized below:  Christina Roberts is an 85 y.o. female with medical history significant of diabetes, hypertension, who presented to the ER with chest pain vomiting and epigastric pain.   Assessment/Plan:   Principal Problem:   Chest pain Active Problems:   UTI (urinary tract infection)   Diabetes (HCC)   Essential hypertension  N/V with reproducible chest pain: -cycle CE -Echocardiogram.   -d/c heparin and nitro -IV PPI -clear diet-- advance as tolerated -LFTs normal, lipase normal  diabetes:  -Sliding scale insulin   UTI:  -on rocephin -culture pending -denies symptoms  essential hypertension: Continue home regimen and monitor closely   COVID 19 test pending results  Family Communication/Anticipated D/C date and plan/Code Status   DVT prophylaxis: Lovenox ordered. Code Status: Full Code.  Family Communication: Disposition Plan: has n/v-- started IVF- ? Gastroenteritis-- advance diet at tolerated   Medical Consultants:    None.     Subjective:   C/o burning in her epigastric region C/o vomiting even gingerale  Objective:    Vitals:   10/15/18 0055 10/15/18 0341 10/15/18 0500 10/15/18 0728  BP: (!) 167/70 (!) 171/70  (!) 148/62  Pulse: 79 83  82  Resp:  18  16  Temp:  97.7 F (36.5 C)  98.3 F (36.8 C)  TempSrc:  Oral  Oral  SpO2:  100%  99%  Weight:   76.7 kg   Height:        Intake/Output Summary (Last 24 hours) at 10/15/2018 1021 Last data filed at 10/15/2018 0859 Gross per 24 hour  Intake 766.63 ml  Output 300 ml  Net 466.63 ml   Filed Weights   10/14/18 1509 10/14/18 2200 10/15/18 0500  Weight: 77.1 kg 77.4 kg 76.7 kg    Exam: In bed, emesis basin at side Chest wall tender to palpation No increased work of breathing +Bs,epigastric region tender to  palpation Min LE edema  Data Reviewed:   I have personally reviewed following labs and imaging studies:  Labs: Labs show the following:   Basic Metabolic Panel: Recent Labs  Lab 10/14/18 1840  NA 136  K 4.1  CL 97*  CO2 28  GLUCOSE 126*  BUN 30*  CREATININE 1.33*  CALCIUM 10.3   GFR Estimated Creatinine Clearance: 29.4 mL/min (A) (by C-G formula based on SCr of 1.33 mg/dL (H)). Liver Function Tests: Recent Labs  Lab 10/14/18 1840  AST 22  ALT 16  ALKPHOS 64  BILITOT 1.3*  PROT 7.3  ALBUMIN 4.0   Recent Labs  Lab 10/14/18 1840  LIPASE 27   No results for input(s): AMMONIA in the last 168 hours. Coagulation profile No results for input(s): INR, PROTIME in the last 168 hours.  CBC: Recent Labs  Lab 10/14/18 1840  WBC 9.6  NEUTROABS 6.4  HGB 12.4  HCT 38.7  MCV 89.4  PLT 263   Cardiac Enzymes: Recent Labs  Lab 10/14/18 1840 10/14/18 2308 10/15/18 0118  TROPONINI 0.03* 0.03* 0.04*   BNP (last 3 results) No results for input(s): PROBNP in the last 8760 hours. CBG: Recent Labs  Lab 10/15/18 0528  GLUCAP 122*   D-Dimer: No results for input(s): DDIMER in the last 72 hours. Hgb A1c: No results for input(s): HGBA1C in the last 72 hours. Lipid Profile: No results  for input(s): CHOL, HDL, LDLCALC, TRIG, CHOLHDL, LDLDIRECT in the last 72 hours. Thyroid function studies: No results for input(s): TSH, T4TOTAL, T3FREE, THYROIDAB in the last 72 hours.  Invalid input(s): FREET3 Anemia work up: No results for input(s): VITAMINB12, FOLATE, FERRITIN, TIBC, IRON, RETICCTPCT in the last 72 hours. Sepsis Labs: Recent Labs  Lab 10/14/18 1840  WBC 9.6    Microbiology No results found for this or any previous visit (from the past 240 hour(s)).  Procedures and diagnostic studies:  Ct Abdomen Pelvis Wo Contrast  Result Date: 10/14/2018 CLINICAL DATA:  Nausea and vomiting EXAM: CT ABDOMEN AND PELVIS WITHOUT CONTRAST TECHNIQUE: Multidetector CT  imaging of the abdomen and pelvis was performed following the standard protocol without IV contrast. COMPARISON:  None. FINDINGS: Lower chest: Lung bases demonstrate no acute consolidation or effusion. The heart size is within normal limits. Dense mitral calcification. Moderate hiatal hernia. Hepatobiliary: No focal liver abnormality is seen. Status post cholecystectomy. No biliary dilatation. Pancreas: Unremarkable. No pancreatic ductal dilatation or surrounding inflammatory changes. Spleen: Normal in size without focal abnormality. Adrenals/Urinary Tract: Adrenal glands are within normal limits. Mildly atrophic kidneys. No hydronephrosis. Probable cyst lower pole left kidney. Bladder is unremarkable Stomach/Bowel: Stomach is nonenlarged. No dilated small bowel. No colon wall thickening. Diverticular disease of the colon without acute inflammatory change. Appendix not well seen but no right lower quadrant inflammation. Vascular/Lymphatic: Moderate aortic atherosclerosis without aneurysm. No significantly enlarged lymph nodes Reproductive: Status post hysterectomy. No adnexal masses. Other: Negative for free air or free fluid. Previous ventral hernia repair. Small moderate supraumbilical fat containing ventral hernia. Musculoskeletal: Chronic compression fracture L1. Degenerative changes without acute osseous abnormality IMPRESSION: 1. No CT evidence for acute intracranial abnormality. 2. Moderate hiatal hernia 3. Colon diverticular disease without acute inflammatory process 4. Fat containing supraumbilical ventral hernia 5. Chronic compression fracture L1 Electronically Signed   By: Donavan Foil M.D.   On: 10/14/2018 20:14   Dg Chest Port 1 View  Result Date: 10/14/2018 CLINICAL DATA:  Chest pain EXAM: PORTABLE CHEST 1 VIEW COMPARISON:  None. FINDINGS: Post sternotomy changes. No focal consolidation or effusion. Normal heart size. Aortic atherosclerosis. No pneumothorax. IMPRESSION: No active disease.  Electronically Signed   By: Donavan Foil M.D.   On: 10/14/2018 16:14    Medications:   . acidophilus  1 capsule Oral Daily  . aspirin  81 mg Oral Daily  . atorvastatin  20 mg Oral Daily  . capsaicin   Topical BID  . cholecalciferol  1,000 Units Oral Daily  . diclofenac sodium  2 g Topical QID  . furosemide  40 mg Oral Daily  . gabapentin  100-300 mg Oral QHS  . insulin aspart  0-5 Units Subcutaneous QHS  . insulin aspart  0-9 Units Subcutaneous TID WC  . metoprolol succinate  12.5 mg Oral Daily  . multivitamin with minerals  1 tablet Oral Daily  . ondansetron  4 mg Oral Once  . pantoprazole (PROTONIX) IV  40 mg Intravenous Q12H  . PARoxetine  20 mg Oral Daily  . ramipril  10 mg Oral Daily   Continuous Infusions: . sodium chloride    . cefTRIAXone (ROCEPHIN)  IV 1 g (10/15/18 0513)     LOS: 0 days   Geradine Girt  [facility]   How to contact the [facility] Attending or Consulting provider [place] or covering provider during after hours [place], for this patient?  1. Check the care team in [facility] and look  for a) attending/consulting [place] provider listed and b) the [facility] team listed 2. Log into [email or web address] and use [place]'s universal password to access. If you do not have the password, please contact the hospital operator. 3. Locate the [facility] provider you are looking for under [facility] and page to a number that you can be directly reached. 4. If you still have difficulty reaching the provider, please page the [facility] (Director on Call) for the Hospitalists listed on amion for assistance.  10/15/2018, 10:21 AM

## 2018-10-15 NOTE — Progress Notes (Signed)
Troponin 0.04, was 0.03, CP has resolved, MD notified, will continue to monitor, Thanks Arvella Nigh RN.

## 2018-10-15 NOTE — Progress Notes (Signed)
Patient nauseated and vomited most of what was consumed today until after Phenergan was given, whereby she appeared to achieve significant relief and was able to consume 100% of supper (clear liquids).  Lethargic as well, which she attributes to not having slept throughout much of the night.  Will pass on to next shift that possibly Morphine may have attributed to bout of vomiting this am, as she had never taken this medication before.  She was unable to take any po medications due to her significant nausea.  Heparin was discontinued as per MD.  Chest pain resolved by early afternoon.  Patient expressed complete relief of this pain.

## 2018-10-16 ENCOUNTER — Inpatient Hospital Stay (HOSPITAL_COMMUNITY): Payer: Medicare Other

## 2018-10-16 ENCOUNTER — Encounter (HOSPITAL_COMMUNITY): Payer: Self-pay | Admitting: Internal Medicine

## 2018-10-16 DIAGNOSIS — K2970 Gastritis, unspecified, without bleeding: Secondary | ICD-10-CM

## 2018-10-16 DIAGNOSIS — K5790 Diverticulosis of intestine, part unspecified, without perforation or abscess without bleeding: Secondary | ICD-10-CM | POA: Diagnosis present

## 2018-10-16 DIAGNOSIS — K449 Diaphragmatic hernia without obstruction or gangrene: Secondary | ICD-10-CM

## 2018-10-16 DIAGNOSIS — S32010A Wedge compression fracture of first lumbar vertebra, initial encounter for closed fracture: Secondary | ICD-10-CM | POA: Diagnosis present

## 2018-10-16 DIAGNOSIS — K439 Ventral hernia without obstruction or gangrene: Secondary | ICD-10-CM | POA: Diagnosis present

## 2018-10-16 DIAGNOSIS — R079 Chest pain, unspecified: Secondary | ICD-10-CM

## 2018-10-16 LAB — CBC
HCT: 37.6 % (ref 36.0–46.0)
Hemoglobin: 11.7 g/dL — ABNORMAL LOW (ref 12.0–15.0)
MCH: 28.8 pg (ref 26.0–34.0)
MCHC: 31.1 g/dL (ref 30.0–36.0)
MCV: 92.6 fL (ref 80.0–100.0)
Platelets: 227 10*3/uL (ref 150–400)
RBC: 4.06 MIL/uL (ref 3.87–5.11)
RDW: 12.3 % (ref 11.5–15.5)
WBC: 9 10*3/uL (ref 4.0–10.5)
nRBC: 0 % (ref 0.0–0.2)

## 2018-10-16 LAB — BASIC METABOLIC PANEL
Anion gap: 10 (ref 5–15)
BUN: 24 mg/dL — ABNORMAL HIGH (ref 8–23)
CO2: 28 mmol/L (ref 22–32)
Calcium: 9.2 mg/dL (ref 8.9–10.3)
Chloride: 102 mmol/L (ref 98–111)
Creatinine, Ser: 1.37 mg/dL — ABNORMAL HIGH (ref 0.44–1.00)
GFR calc Af Amer: 39 mL/min — ABNORMAL LOW (ref >60)
GFR calc non Af Amer: 34 mL/min — ABNORMAL LOW (ref >60)
Glucose, Bld: 100 mg/dL — ABNORMAL HIGH (ref 70–99)
Potassium: 3.8 mmol/L (ref 3.5–5.1)
Sodium: 140 mmol/L (ref 135–145)

## 2018-10-16 LAB — ECHOCARDIOGRAM COMPLETE
Height: 66 in
Weight: 2783.09 oz

## 2018-10-16 LAB — GLUCOSE, CAPILLARY
Glucose-Capillary: 104 mg/dL — ABNORMAL HIGH (ref 70–99)
Glucose-Capillary: 111 mg/dL — ABNORMAL HIGH (ref 70–99)

## 2018-10-16 LAB — HEMOGLOBIN A1C
Hgb A1c MFr Bld: 6.3 % — ABNORMAL HIGH (ref 4.8–5.6)
Mean Plasma Glucose: 134 mg/dL

## 2018-10-16 MED ORDER — ENOXAPARIN SODIUM 30 MG/0.3ML ~~LOC~~ SOLN: 30.0000 mg | SUBCUTANEOUS | Status: DC

## 2018-10-16 MED ORDER — PANTOPRAZOLE SODIUM 40 MG PO TBEC: 40.0000 mg | DELAYED_RELEASE_TABLET | Freq: Two times a day (BID) | ORAL | Status: DC

## 2018-10-16 MED ORDER — CEPHALEXIN 250 MG PO CAPS: 250.0000 mg | ORAL_CAPSULE | Freq: Two times a day (BID) | ORAL | Status: DC

## 2018-10-16 MED ORDER — CEPHALEXIN 250 MG PO CAPS: 250.0000 mg | ORAL_CAPSULE | Freq: Two times a day (BID) | ORAL | 0 refills | Status: AC

## 2018-10-16 MED ORDER — PANTOPRAZOLE SODIUM 40 MG PO TBEC: 40.0000 mg | DELAYED_RELEASE_TABLET | Freq: Two times a day (BID) | ORAL | 0 refills | Status: DC

## 2018-10-16 NOTE — TOC Transition Note (Signed)
Transition of Care [facility]) - CM/SW Discharge Note   Patient Details  Name: Christina Roberts MRN: [id number] Date of Birth: Mar 06, 1927  [facility] CM/SW Contact:  Bethena Roys, RN Phone Number: 10/16/2018, 3:24 PM   Clinical Narrative: pt presented for Chest Pain- plan to transition home today-with [facility]. CM did offer choice to daughter and she chose [facility]. SOC to begin within 24-48 hours post transition home. Daughter declined DME 3n1. Patient has personal care services in the home. Daughter to provide transportation home. No further needs from CM at this time.     Final next level of care: [place] Barriers to Discharge: No Barriers Identified   Patient Goals and CMS Choice Patient states their goals for this hospitalization and ongoing recovery are:: "to go home with daughter" CMS Medicare.gov Compare Post Acute Care list provided to:: Patient Represenative (must comment)(Daughter) Choice offered to / list presented to : Adult Children  Discharge Placement                       Discharge Plan and Services In-house Referral: NA Discharge Planning Services: CM Consult Post Acute Care Choice: Home Health(Declined 3n1)                    HH Arranged: PT, OT HH Agency: [facility] ([facility]) Date [place]: 10/16/18 Time [employer]: [id number] Representative spoke with at [place]: Linna Hoff  Social Determinants of Health ([place]) Interventions     Readmission Risk Interventions No flowsheet data found.

## 2018-10-16 NOTE — Progress Notes (Signed)
Patient repositioned in bed with no distress, 2 L mouth  breather  reported rested well, Tolerating clear liquid meal with no nausea  Pain. Last Bowel Thursday 6/18

## 2018-10-16 NOTE — Progress Notes (Signed)
Echocardiogram 2D Echocardiogram has been performed.  One[place] Deputy Darrien Laakso 10/16/2018, 2:28 PM

## 2018-10-16 NOTE — Discharge Summary (Signed)
Physician Discharge Summary  Christina Roberts PXT:[id number] DOB: July 30, 1926 DOA: 10/14/2018  PCP: Glenford Bayley, DO  Admit date: 10/14/2018 Discharge date: 10/16/2018  Time spent: 45 minutes  Recommendations for Outpatient Follow-up:  1. Follow up with PCP 1-2 weeks for evaluation of symptoms, resolution of UTI and echo results 2. HH PT/OT for strength and endurence   Discharge Diagnoses:  Principal Problem:   Chest pain Active Problems:   UTI (urinary tract infection)   Gastritis   Diabetes ([place])   Essential hypertension   Hiatal hernia   Ventral hernia   Diverticular disease   Closed compression fracture of body of L1 vertebra ([place])   Discharge Condition: stable  Diet recommendation: heart healthy carb modified  Filed Weights   10/14/18 2200 10/15/18 0500 10/16/18 0508  Weight: 77.4 kg 76.7 kg 78.9 kg    History of present illness:  Christina Roberts is a very pleasant 85 y.o. female with medical history significant of diabetes, hypertension, who presented to the ER 6/20 with chest pain vomiting and epigastric pain.  Symptoms started about 2 hours prior to arrival.  She rated as 6 out of 10.  Squeezing in nature.  Patient  had a second episode of vomiting day prior to presentation.  No other complaint.  No fever no sick contacts.  Denied any diarrhea no constipation.  Patient received aspirin from EMS on her way to the ER.  In the ER chest pain was relieved.  She had normal EKG and initial enzymes is only marginally elevated.  Due to risk factors patient is being admitted for observation on rule out MI  Hospital Course:  N/V with reproducible chest pain: resolved at discharge. Suspect gastritis in setting of hiatal hernia aggravated by opiates. Of note, patient given dose of morphine for pain and later had episode of emesis. Troponin slightly elevated but flat. ekg without acute changes. Echo results pending at discharge. CT abdomen pelvis reveals diverticular disease no  diverticulitis, ventral hernia and hiatal hernia and old L1 compression fracture. At discharge tolerating diet. Will discharge with PPI, HH.  diabetes: A1c 6.3. continue home regimen   OEV:[id number] with 50000 e coli. Received 2 doses rocephin. She remained afebrile and non-toxic appearing. Will discharge with 5 days keflex.   essential hypertension: fair control. Continue home meds  Procedures:  Echo results pending at discharge  Consultations:    Discharge Exam: Vitals:   10/16/18 0723 10/16/18 1115  BP: (!) 141/62 (!) 126/58  Pulse: 81 79  Resp: 16 16  Temp: (!) 97.5 F (36.4 C) 98.3 F (36.8 C)  SpO2: 98% 97%    General: awake alert oriented x3 no acute distress Cardiovascular: rrr no mgr no LE edema Respiratory: normal effort BS clear bilaterally no wheeze Discharge Instructions   Discharge Instructions    Call MD for:  persistant dizziness or light-headedness   Complete by: As directed    Call MD for:  persistant nausea and vomiting   Complete by: As directed    Call MD for:  severe uncontrolled pain   Complete by: As directed    Diet - low sodium heart healthy   Complete by: As directed    Discharge instructions   Complete by: As directed    Take medications as prescribed Sit up straight for 60 minutes after eating Minimize pain meds as able   Increase activity slowly   Complete by: As directed      Allergies as of 10/16/2018      Reactions  Fosamax [alendronate Sodium] Swelling   Swelling of lips and nose      Medication List    TAKE these medications   Aleve 220 MG tablet Generic drug: naproxen sodium Take 220 mg by mouth as needed (pain).   ASPERCREME EX Apply 1 application topically daily. Notes to patient: 6/23   aspirin 81 MG chewable tablet Chew 81 mg by mouth daily. Notes to patient: 6/23   atorvastatin 20 MG tablet Commonly known as: LIPITOR Take 20 mg by mouth daily. Notes to patient: 6/23   cephALEXin 250 MG  capsule Commonly known as: KEFLEX Take 1 capsule (250 mg total) by mouth every 12 (twelve) hours for 5 days. Start taking on: October 17, 2018 Notes to patient: 6/23   diclofenac sodium 1 % Gel Commonly known as: VOLTAREN Apply 2 g topically 4 (four) times daily. Notes to patient: Back rub   furosemide 40 MG tablet Commonly known as: LASIX Take 40 mg by mouth daily. Notes to patient: 6/23   gabapentin 100 MG capsule Commonly known as: NEURONTIN Take 100-300 mg by mouth at bedtime. Notes to patient: 6/22 9pm   HYDROcodone-acetaminophen 5-325 MG tablet Commonly known as: NORCO/VICODIN Take 1 tablet by mouth every 6 (six) hours as needed for pain.   metFORMIN 500 MG tablet Commonly known as: GLUCOPHAGE Take 500 mg by mouth at bedtime.   metoprolol succinate 25 MG 24 hr tablet Commonly known as: TOPROL-XL Take 12.5 mg by mouth daily.   ONE DAILY MULTIVITAMIN WOMEN PO Take 1 tablet by mouth daily.   pantoprazole 40 MG tablet Commonly known as: PROTONIX Take 1 tablet (40 mg total) by mouth 2 (two) times daily before a meal. Take 2 tabs daily for 30 days then take 1 tab daily Start taking on: October 17, 2018   PARoxetine 20 MG tablet Commonly known as: PAXIL Take 20 mg by mouth daily. Notes to patient: 6/23   PROBIOTIC PRODUCT PO Take 1 tablet by mouth daily. Notes to patient: 6/23   ramipril 10 MG capsule Commonly known as: ALTACE Take 10 mg by mouth daily. Notes to patient: 6/23   Vitamin D3 50 MCG (2000 UT) Tabs Take 1 tablet by mouth daily. Notes to patient: 6/23            Durable Medical Equipment  (From admission, onward)         Start     Ordered   10/16/18 1429  DME 3-in-1  Once     10/16/18 1429         Allergies  Allergen Reactions  . Fosamax [Alendronate Sodium] Swelling    Swelling of lips and nose   Follow-up Information    Lyman Bishop, DO. Go on 10/24/2018.   Specialty: Family Medicine Why: @10 :45am Contact  information: [place] [id number] [phone]            The results of significant diagnostics from this hospitalization (including imaging, microbiology, ancillary and laboratory) are listed below for reference.    Significant Diagnostic Studies: Ct Abdomen Pelvis Wo Contrast  Result Date: 10/14/2018 CLINICAL DATA:  Nausea and vomiting EXAM: CT ABDOMEN AND PELVIS WITHOUT CONTRAST TECHNIQUE: Multidetector CT imaging of the abdomen and pelvis was performed following the standard protocol without IV contrast. COMPARISON:  None. FINDINGS: Lower chest: Lung bases demonstrate no acute consolidation or effusion. The heart size is within normal limits. Dense mitral calcification. Moderate hiatal hernia. Hepatobiliary: No focal liver abnormality is seen.  Status post cholecystectomy. No biliary dilatation. Pancreas: Unremarkable. No pancreatic ductal dilatation or surrounding inflammatory changes. Spleen: Normal in size without focal abnormality. Adrenals/Urinary Tract: Adrenal glands are within normal limits. Mildly atrophic kidneys. No hydronephrosis. Probable cyst lower pole left kidney. Bladder is unremarkable Stomach/Bowel: Stomach is nonenlarged. No dilated small bowel. No colon wall thickening. Diverticular disease of the colon without acute inflammatory change. Appendix not well seen but no right lower quadrant inflammation. Vascular/Lymphatic: Moderate aortic atherosclerosis without aneurysm. No significantly enlarged lymph nodes Reproductive: Status post hysterectomy. No adnexal masses. Other: Negative for free air or free fluid. Previous ventral hernia repair. Small moderate supraumbilical fat containing ventral hernia. Musculoskeletal: Chronic compression fracture L1. Degenerative changes without acute osseous abnormality IMPRESSION: 1. No CT evidence for acute intracranial abnormality. 2. Moderate hiatal hernia 3. Colon diverticular disease without acute inflammatory  process 4. Fat containing supraumbilical ventral hernia 5. Chronic compression fracture L1 Electronically Signed   By: Donavan Foil M.D.   On: 10/14/2018 20:14   Dg Chest Port 1 View  Result Date: 10/14/2018 CLINICAL DATA:  Chest pain EXAM: PORTABLE CHEST 1 VIEW COMPARISON:  None. FINDINGS: Post sternotomy changes. No focal consolidation or effusion. Normal heart size. Aortic atherosclerosis. No pneumothorax. IMPRESSION: No active disease. Electronically Signed   By: Donavan Foil M.D.   On: 10/14/2018 16:14    Microbiology: Recent Results (from the past 240 hour(s))  Urine culture     Status: Abnormal (Preliminary result)   Collection Time: 10/14/18  7:24 PM   Specimen: Urine, Random  Result Value Ref Range Status   Specimen Description URINE, RANDOM  Final   Special Requests NONE  Final   Culture (A)  Final    50,000 COLONIES/mL ESCHERICHIA COLI SUSCEPTIBILITIES TO FOLLOW Performed at [facility], [place] [address] [place], [place] [address]    Report Status PENDING  Incomplete     Labs: Basic Metabolic Panel: Recent Labs  Lab 10/14/18 1840 10/16/18 0651  NA 136 140  K 4.1 3.8  CL 97* 102  CO2 28 28  GLUCOSE 126* 100*  BUN 30* 24*  CREATININE 1.33* 1.37*  CALCIUM 10.3 9.2   Liver Function Tests: Recent Labs  Lab 10/14/18 1840  AST 22  ALT 16  ALKPHOS 64  BILITOT 1.3*  PROT 7.3  ALBUMIN 4.0   Recent Labs  Lab 10/14/18 1840  LIPASE 27   No results for input(s): AMMONIA in the last 168 hours. CBC: Recent Labs  Lab 10/14/18 1840 10/16/18 0651  WBC 9.6 9.0  NEUTROABS 6.4  --   HGB 12.4 11.7*  HCT 38.7 37.6  MCV 89.4 92.6  PLT 263 227   Cardiac Enzymes: Recent Labs  Lab 10/14/18 1840 10/14/18 2308 10/15/18 0118 10/15/18 1221  TROPONINI 0.03* 0.03* 0.04* 0.03*   BNP: BNP (last 3 results) No results for input(s): BNP in the last 8760 hours.  ProBNP (last 3 results) No results for input(s): PROBNP in the last 8760 hours.  CBG: Recent  Labs  Lab 10/15/18 1120 10/15/18 1621 10/15/18 2150 10/16/18 0528 10/16/18 1112  GLUCAP 149* 108* 93 104* 111*       Signed:  Radene Gunning NP.  [facility] 10/16/2018, 2:55 PM

## 2018-10-16 NOTE — Evaluation (Signed)
Physical Therapy Evaluation Patient Details Name: Christina Roberts MRN: [id number] DOB: [birth year] Today's Date: 10/16/2018   History of Present Illness  85 yo admitted with CP and nausea with negative cardiac enzymes awaiting covid 19 results. PMHx: HTN, DM  Clinical Impression  Pt resting in bed on arrival with 2L O2 but able to maintain 93-95% on RA throughout session. Pt reports fatigue and chronic back pain limiting mobility with pt only performing limited household gait at baseline with assist of daughter for getting up in the morning and all homemaking. Pt reports desire to be more mobile. Pt with decreased strength, balance, transfers, function and gait who will benefit from acute therapy to maximize mobility and activity tolerance to decrease burden of care. Recommend OOB daily with nursing staff.     Follow Up Recommendations Home health PT;Supervision/Assistance - 24 hour    Equipment Recommendations  3in1 (PT)    Recommendations for Other Services       Precautions / Restrictions Precautions Precautions: Fall      Mobility  Bed Mobility Overal bed mobility: Needs Assistance Bed Mobility: Supine to Sit     Supine to sit: HOB elevated;Min assist     General bed mobility comments: HOB 35 degrees with assist to levate trunk and fully scoot to EOB  Transfers Overall transfer level: Needs assistance   Transfers: Sit to/from Stand Sit to Stand: Min guard         General transfer comment: increased time with momentum to stand  Ambulation/Gait Ambulation/Gait assistance: Min assist Gait Distance (Feet): 34 Feet Assistive device: Rolling walker (2 wheeled) Gait Pattern/deviations: Step-through pattern;Decreased stride length;Trunk flexed   Gait velocity interpretation: <1.8 ft/sec, indicate of risk for recurrent falls General Gait Details: pt with reliance on rW with flexed trunk throughout gait with assist to turn and direct RW with cues for posture and  proximity to [employer]    Modified Rankin (Stroke Patients Only)       Balance Overall balance assessment: Needs assistance   Sitting balance-Leahy Scale: Fair       Standing balance-Leahy Scale: Poor Standing balance comment: bil UE support to achieve standing                             Pertinent Vitals/Pain Pain Assessment: No/denies pain    Home Living Family/patient expects to be discharged to:: Private residence Living Arrangements: Children Available Help at Discharge: Family Type of Home: House Home Access: Stairs to enter Entrance Stairs-Rails: Right Entrance Stairs-Number of Steps: 3 Home Layout: One level Home Equipment: Walker - 2 wheels;Cane - single point      Prior Function Level of Independence: Needs assistance   Gait / Transfers Assistance Needed: walks limited household distance with RW, assist to get OOB  ADL's / Homemaking Assistance Needed: assist for sponge bathing, dresses on her own, daughter performs homemaking, pt reports incontinence of bowel and bladder        Hand Dominance        Extremity/Trunk Assessment   Upper Extremity Assessment Upper Extremity Assessment: Generalized weakness    Lower Extremity Assessment Lower Extremity Assessment: Generalized weakness    Cervical / Trunk Assessment Cervical / Trunk Assessment: Kyphotic  Communication   Communication: HOH  Cognition Arousal/Alertness: Awake/alert Behavior During Therapy: Flat affect Overall Cognitive Status: No family/caregiver present to determine baseline cognitive functioning Area of  Impairment: Safety/judgement;Problem solving                         Safety/Judgement: Decreased awareness of deficits;Decreased awareness of safety   Problem Solving: Slow processing        General Comments      Exercises     Assessment/Plan    PT Assessment Patient needs continued PT services  PT  Problem List Decreased strength;Decreased mobility;Decreased safety awareness;Decreased activity tolerance;Decreased balance;Decreased knowledge of use of DME;Decreased cognition       PT Treatment Interventions DME instruction;Therapeutic activities;Gait training;Therapeutic exercise;Patient/family education;Stair training;Balance training;Functional mobility training    PT Goals (Current goals can be found in the Care Plan section)  Acute Rehab PT Goals Patient Stated Goal: return home PT Goal Formulation: With patient Time For Goal Achievement: 10/30/18 Potential to Achieve Goals: Fair    Frequency Min 3X/week   Barriers to discharge        Co-evaluation               AM-PAC PT "6 Clicks" Mobility  Outcome Measure Help needed turning from your back to your side while in a flat bed without using bedrails?: A Little Help needed moving from lying on your back to sitting on the side of a flat bed without using bedrails?: A Little Help needed moving to and from a bed to a chair (including a wheelchair)?: A Little Help needed standing up from a chair using your arms (e.g., wheelchair or bedside chair)?: A Little Help needed to walk in hospital room?: A Little Help needed climbing 3-5 steps with a railing? : A Lot 6 Click Score: 17    End of Session Equipment Utilized During Treatment: Gait belt Activity Tolerance: Patient tolerated treatment well Patient left: in chair;with chair alarm set;with call bell/phone within reach Nurse Communication: Mobility status PT Visit Diagnosis: Other abnormalities of gait and mobility (R26.89);Muscle weakness (generalized) (M62.81)    Time: 8184-0375 PT Time Calculation (min) (ACUTE ONLY): 19 min   Charges:   PT Evaluation $PT Eval Moderate Complexity: 1 Mod          [place], PT Acute Rehabilitation Services Pager: [phone] Office: [phone]   Sandy Salaam Zyasia Halbleib 10/16/2018, 12:18 PM

## 2018-10-17 LAB — URINE CULTURE: Culture: 50000 — AB

## 2018-10-18 DIAGNOSIS — S32010D Wedge compression fracture of first lumbar vertebra, subsequent encounter for fracture with routine healing: Secondary | ICD-10-CM | POA: Diagnosis not present

## 2018-10-18 DIAGNOSIS — K449 Diaphragmatic hernia without obstruction or gangrene: Secondary | ICD-10-CM | POA: Diagnosis not present

## 2018-10-18 DIAGNOSIS — K2970 Gastritis, unspecified, without bleeding: Secondary | ICD-10-CM | POA: Diagnosis not present

## 2018-10-18 DIAGNOSIS — K439 Ventral hernia without obstruction or gangrene: Secondary | ICD-10-CM | POA: Diagnosis not present

## 2018-10-18 DIAGNOSIS — N390 Urinary tract infection, site not specified: Secondary | ICD-10-CM | POA: Diagnosis not present

## 2018-10-18 DIAGNOSIS — E119 Type 2 diabetes mellitus without complications: Secondary | ICD-10-CM | POA: Diagnosis not present

## 2018-10-18 DIAGNOSIS — I10 Essential (primary) hypertension: Secondary | ICD-10-CM | POA: Diagnosis not present

## 2018-10-18 DIAGNOSIS — B9620 Unspecified Escherichia coli [E. coli] as the cause of diseases classified elsewhere: Secondary | ICD-10-CM | POA: Diagnosis not present

## 2018-10-19 LAB — NOVEL CORONAVIRUS, NAA (HOSP ORDER, SEND-OUT TO REF LAB; TAT 18-24 HRS): SARS-CoV-2, NAA: NOT DETECTED

## 2018-10-24 DIAGNOSIS — K219 Gastro-esophageal reflux disease without esophagitis: Secondary | ICD-10-CM | POA: Diagnosis not present

## 2018-10-24 DIAGNOSIS — R079 Chest pain, unspecified: Secondary | ICD-10-CM | POA: Diagnosis not present

## 2018-10-24 DIAGNOSIS — E119 Type 2 diabetes mellitus without complications: Secondary | ICD-10-CM | POA: Diagnosis not present

## 2018-10-24 DIAGNOSIS — I10 Essential (primary) hypertension: Secondary | ICD-10-CM | POA: Diagnosis not present

## 2018-10-24 DIAGNOSIS — K2970 Gastritis, unspecified, without bleeding: Secondary | ICD-10-CM | POA: Diagnosis not present

## 2018-10-24 DIAGNOSIS — B9620 Unspecified Escherichia coli [E. coli] as the cause of diseases classified elsewhere: Secondary | ICD-10-CM | POA: Diagnosis not present

## 2018-10-24 DIAGNOSIS — K449 Diaphragmatic hernia without obstruction or gangrene: Secondary | ICD-10-CM | POA: Diagnosis not present

## 2018-10-24 DIAGNOSIS — N390 Urinary tract infection, site not specified: Secondary | ICD-10-CM | POA: Diagnosis not present

## 2018-10-24 DIAGNOSIS — Z09 Encounter for follow-up examination after completed treatment for conditions other than malignant neoplasm: Secondary | ICD-10-CM | POA: Diagnosis not present

## 2018-10-24 DIAGNOSIS — N183 Chronic kidney disease, stage 3 (moderate): Secondary | ICD-10-CM | POA: Diagnosis not present

## 2018-10-27 DIAGNOSIS — E119 Type 2 diabetes mellitus without complications: Secondary | ICD-10-CM | POA: Diagnosis not present

## 2018-10-27 DIAGNOSIS — K449 Diaphragmatic hernia without obstruction or gangrene: Secondary | ICD-10-CM | POA: Diagnosis not present

## 2018-10-27 DIAGNOSIS — I10 Essential (primary) hypertension: Secondary | ICD-10-CM | POA: Diagnosis not present

## 2018-10-27 DIAGNOSIS — B9620 Unspecified Escherichia coli [E. coli] as the cause of diseases classified elsewhere: Secondary | ICD-10-CM | POA: Diagnosis not present

## 2018-10-27 DIAGNOSIS — N390 Urinary tract infection, site not specified: Secondary | ICD-10-CM | POA: Diagnosis not present

## 2018-10-27 DIAGNOSIS — K2970 Gastritis, unspecified, without bleeding: Secondary | ICD-10-CM | POA: Diagnosis not present

## 2018-10-30 DIAGNOSIS — K449 Diaphragmatic hernia without obstruction or gangrene: Secondary | ICD-10-CM | POA: Diagnosis not present

## 2018-10-30 DIAGNOSIS — N390 Urinary tract infection, site not specified: Secondary | ICD-10-CM | POA: Diagnosis not present

## 2018-10-30 DIAGNOSIS — B9620 Unspecified Escherichia coli [E. coli] as the cause of diseases classified elsewhere: Secondary | ICD-10-CM | POA: Diagnosis not present

## 2018-10-30 DIAGNOSIS — E119 Type 2 diabetes mellitus without complications: Secondary | ICD-10-CM | POA: Diagnosis not present

## 2018-10-30 DIAGNOSIS — K2970 Gastritis, unspecified, without bleeding: Secondary | ICD-10-CM | POA: Diagnosis not present

## 2018-10-30 DIAGNOSIS — I10 Essential (primary) hypertension: Secondary | ICD-10-CM | POA: Diagnosis not present

## 2018-10-31 DIAGNOSIS — B9620 Unspecified Escherichia coli [E. coli] as the cause of diseases classified elsewhere: Secondary | ICD-10-CM | POA: Diagnosis not present

## 2018-10-31 DIAGNOSIS — K449 Diaphragmatic hernia without obstruction or gangrene: Secondary | ICD-10-CM | POA: Diagnosis not present

## 2018-10-31 DIAGNOSIS — I10 Essential (primary) hypertension: Secondary | ICD-10-CM | POA: Diagnosis not present

## 2018-10-31 DIAGNOSIS — K2970 Gastritis, unspecified, without bleeding: Secondary | ICD-10-CM | POA: Diagnosis not present

## 2018-10-31 DIAGNOSIS — E119 Type 2 diabetes mellitus without complications: Secondary | ICD-10-CM | POA: Diagnosis not present

## 2018-10-31 DIAGNOSIS — N390 Urinary tract infection, site not specified: Secondary | ICD-10-CM | POA: Diagnosis not present

## 2018-11-02 DIAGNOSIS — B9620 Unspecified Escherichia coli [E. coli] as the cause of diseases classified elsewhere: Secondary | ICD-10-CM | POA: Diagnosis not present

## 2018-11-02 DIAGNOSIS — I10 Essential (primary) hypertension: Secondary | ICD-10-CM | POA: Diagnosis not present

## 2018-11-02 DIAGNOSIS — K449 Diaphragmatic hernia without obstruction or gangrene: Secondary | ICD-10-CM | POA: Diagnosis not present

## 2018-11-02 DIAGNOSIS — N390 Urinary tract infection, site not specified: Secondary | ICD-10-CM | POA: Diagnosis not present

## 2018-11-02 DIAGNOSIS — E119 Type 2 diabetes mellitus without complications: Secondary | ICD-10-CM | POA: Diagnosis not present

## 2018-11-02 DIAGNOSIS — K2970 Gastritis, unspecified, without bleeding: Secondary | ICD-10-CM | POA: Diagnosis not present

## 2018-11-07 DIAGNOSIS — I10 Essential (primary) hypertension: Secondary | ICD-10-CM | POA: Diagnosis not present

## 2018-11-07 DIAGNOSIS — E119 Type 2 diabetes mellitus without complications: Secondary | ICD-10-CM | POA: Diagnosis not present

## 2018-11-07 DIAGNOSIS — K2970 Gastritis, unspecified, without bleeding: Secondary | ICD-10-CM | POA: Diagnosis not present

## 2018-11-07 DIAGNOSIS — N390 Urinary tract infection, site not specified: Secondary | ICD-10-CM | POA: Diagnosis not present

## 2018-11-07 DIAGNOSIS — B9620 Unspecified Escherichia coli [E. coli] as the cause of diseases classified elsewhere: Secondary | ICD-10-CM | POA: Diagnosis not present

## 2018-11-07 DIAGNOSIS — K449 Diaphragmatic hernia without obstruction or gangrene: Secondary | ICD-10-CM | POA: Diagnosis not present

## 2018-11-08 DIAGNOSIS — E119 Type 2 diabetes mellitus without complications: Secondary | ICD-10-CM | POA: Diagnosis not present

## 2018-11-08 DIAGNOSIS — I10 Essential (primary) hypertension: Secondary | ICD-10-CM | POA: Diagnosis not present

## 2018-11-08 DIAGNOSIS — B9620 Unspecified Escherichia coli [E. coli] as the cause of diseases classified elsewhere: Secondary | ICD-10-CM | POA: Diagnosis not present

## 2018-11-08 DIAGNOSIS — N390 Urinary tract infection, site not specified: Secondary | ICD-10-CM | POA: Diagnosis not present

## 2018-11-08 DIAGNOSIS — K2970 Gastritis, unspecified, without bleeding: Secondary | ICD-10-CM | POA: Diagnosis not present

## 2018-11-08 DIAGNOSIS — K449 Diaphragmatic hernia without obstruction or gangrene: Secondary | ICD-10-CM | POA: Diagnosis not present

## 2018-11-09 DIAGNOSIS — I10 Essential (primary) hypertension: Secondary | ICD-10-CM | POA: Diagnosis not present

## 2018-11-09 DIAGNOSIS — N390 Urinary tract infection, site not specified: Secondary | ICD-10-CM | POA: Diagnosis not present

## 2018-11-09 DIAGNOSIS — K2970 Gastritis, unspecified, without bleeding: Secondary | ICD-10-CM | POA: Diagnosis not present

## 2018-11-09 DIAGNOSIS — B9620 Unspecified Escherichia coli [E. coli] as the cause of diseases classified elsewhere: Secondary | ICD-10-CM | POA: Diagnosis not present

## 2018-11-09 DIAGNOSIS — E119 Type 2 diabetes mellitus without complications: Secondary | ICD-10-CM | POA: Diagnosis not present

## 2018-11-09 DIAGNOSIS — K449 Diaphragmatic hernia without obstruction or gangrene: Secondary | ICD-10-CM | POA: Diagnosis not present

## 2018-11-13 DIAGNOSIS — B9620 Unspecified Escherichia coli [E. coli] as the cause of diseases classified elsewhere: Secondary | ICD-10-CM | POA: Diagnosis not present

## 2018-11-13 DIAGNOSIS — N390 Urinary tract infection, site not specified: Secondary | ICD-10-CM | POA: Diagnosis not present

## 2018-11-13 DIAGNOSIS — K449 Diaphragmatic hernia without obstruction or gangrene: Secondary | ICD-10-CM | POA: Diagnosis not present

## 2018-11-13 DIAGNOSIS — E119 Type 2 diabetes mellitus without complications: Secondary | ICD-10-CM | POA: Diagnosis not present

## 2018-11-13 DIAGNOSIS — I10 Essential (primary) hypertension: Secondary | ICD-10-CM | POA: Diagnosis not present

## 2018-11-13 DIAGNOSIS — K2970 Gastritis, unspecified, without bleeding: Secondary | ICD-10-CM | POA: Diagnosis not present

## 2018-11-15 DIAGNOSIS — I10 Essential (primary) hypertension: Secondary | ICD-10-CM | POA: Diagnosis not present

## 2018-11-15 DIAGNOSIS — B9620 Unspecified Escherichia coli [E. coli] as the cause of diseases classified elsewhere: Secondary | ICD-10-CM | POA: Diagnosis not present

## 2018-11-15 DIAGNOSIS — N390 Urinary tract infection, site not specified: Secondary | ICD-10-CM | POA: Diagnosis not present

## 2018-11-15 DIAGNOSIS — E119 Type 2 diabetes mellitus without complications: Secondary | ICD-10-CM | POA: Diagnosis not present

## 2018-11-15 DIAGNOSIS — K2970 Gastritis, unspecified, without bleeding: Secondary | ICD-10-CM | POA: Diagnosis not present

## 2018-11-15 DIAGNOSIS — K449 Diaphragmatic hernia without obstruction or gangrene: Secondary | ICD-10-CM | POA: Diagnosis not present

## 2018-11-17 DIAGNOSIS — K2970 Gastritis, unspecified, without bleeding: Secondary | ICD-10-CM | POA: Diagnosis not present

## 2018-11-17 DIAGNOSIS — B9620 Unspecified Escherichia coli [E. coli] as the cause of diseases classified elsewhere: Secondary | ICD-10-CM | POA: Diagnosis not present

## 2018-11-17 DIAGNOSIS — N390 Urinary tract infection, site not specified: Secondary | ICD-10-CM | POA: Diagnosis not present

## 2018-11-17 DIAGNOSIS — I10 Essential (primary) hypertension: Secondary | ICD-10-CM | POA: Diagnosis not present

## 2018-11-17 DIAGNOSIS — K449 Diaphragmatic hernia without obstruction or gangrene: Secondary | ICD-10-CM | POA: Diagnosis not present

## 2018-11-17 DIAGNOSIS — E119 Type 2 diabetes mellitus without complications: Secondary | ICD-10-CM | POA: Diagnosis not present

## 2018-11-17 DIAGNOSIS — K439 Ventral hernia without obstruction or gangrene: Secondary | ICD-10-CM | POA: Diagnosis not present

## 2018-11-17 DIAGNOSIS — S32010D Wedge compression fracture of first lumbar vertebra, subsequent encounter for fracture with routine healing: Secondary | ICD-10-CM | POA: Diagnosis not present

## 2018-11-21 DIAGNOSIS — B9620 Unspecified Escherichia coli [E. coli] as the cause of diseases classified elsewhere: Secondary | ICD-10-CM | POA: Diagnosis not present

## 2018-11-21 DIAGNOSIS — I10 Essential (primary) hypertension: Secondary | ICD-10-CM | POA: Diagnosis not present

## 2018-11-21 DIAGNOSIS — K2970 Gastritis, unspecified, without bleeding: Secondary | ICD-10-CM | POA: Diagnosis not present

## 2018-11-21 DIAGNOSIS — K449 Diaphragmatic hernia without obstruction or gangrene: Secondary | ICD-10-CM | POA: Diagnosis not present

## 2018-11-21 DIAGNOSIS — E119 Type 2 diabetes mellitus without complications: Secondary | ICD-10-CM | POA: Diagnosis not present

## 2018-11-21 DIAGNOSIS — N390 Urinary tract infection, site not specified: Secondary | ICD-10-CM | POA: Diagnosis not present

## 2018-12-01 DIAGNOSIS — K2970 Gastritis, unspecified, without bleeding: Secondary | ICD-10-CM | POA: Diagnosis not present

## 2018-12-01 DIAGNOSIS — N390 Urinary tract infection, site not specified: Secondary | ICD-10-CM | POA: Diagnosis not present

## 2018-12-01 DIAGNOSIS — B9620 Unspecified Escherichia coli [E. coli] as the cause of diseases classified elsewhere: Secondary | ICD-10-CM | POA: Diagnosis not present

## 2018-12-01 DIAGNOSIS — I10 Essential (primary) hypertension: Secondary | ICD-10-CM | POA: Diagnosis not present

## 2018-12-01 DIAGNOSIS — E119 Type 2 diabetes mellitus without complications: Secondary | ICD-10-CM | POA: Diagnosis not present

## 2018-12-01 DIAGNOSIS — K449 Diaphragmatic hernia without obstruction or gangrene: Secondary | ICD-10-CM | POA: Diagnosis not present

## 2018-12-19 DIAGNOSIS — G8929 Other chronic pain: Secondary | ICD-10-CM | POA: Diagnosis not present

## 2018-12-19 DIAGNOSIS — N183 Chronic kidney disease, stage 3 (moderate): Secondary | ICD-10-CM | POA: Diagnosis not present

## 2018-12-19 DIAGNOSIS — I10 Essential (primary) hypertension: Secondary | ICD-10-CM | POA: Diagnosis not present

## 2018-12-19 DIAGNOSIS — Z23 Encounter for immunization: Secondary | ICD-10-CM | POA: Diagnosis not present

## 2018-12-19 DIAGNOSIS — E1122 Type 2 diabetes mellitus with diabetic chronic kidney disease: Secondary | ICD-10-CM | POA: Diagnosis not present

## 2018-12-19 DIAGNOSIS — E782 Mixed hyperlipidemia: Secondary | ICD-10-CM | POA: Diagnosis not present

## 2018-12-27 DIAGNOSIS — H938X3 Other specified disorders of ear, bilateral: Secondary | ICD-10-CM | POA: Diagnosis not present

## 2018-12-27 DIAGNOSIS — Z79899 Other long term (current) drug therapy: Secondary | ICD-10-CM | POA: Diagnosis not present

## 2018-12-27 DIAGNOSIS — H6123 Impacted cerumen, bilateral: Secondary | ICD-10-CM | POA: Diagnosis not present

## 2018-12-27 DIAGNOSIS — H9193 Unspecified hearing loss, bilateral: Secondary | ICD-10-CM | POA: Diagnosis not present

## 2019-02-08 DIAGNOSIS — E1142 Type 2 diabetes mellitus with diabetic polyneuropathy: Secondary | ICD-10-CM | POA: Diagnosis not present

## 2019-03-20 DIAGNOSIS — G8929 Other chronic pain: Secondary | ICD-10-CM | POA: Diagnosis not present

## 2019-11-08 ENCOUNTER — Ambulatory Visit: Payer: Medicare Other | Admitting: Cardiology

## 2019-12-26 ENCOUNTER — Ambulatory Visit: Payer: Medicare Other | Admitting: Cardiology

## 2020-02-14 ENCOUNTER — Other Ambulatory Visit: Payer: Self-pay

## 2020-02-14 ENCOUNTER — Encounter (HOSPITAL_BASED_OUTPATIENT_CLINIC_OR_DEPARTMENT_OTHER): Payer: Self-pay

## 2020-02-14 ENCOUNTER — Ambulatory Visit: Payer: Self-pay | Admitting: *Deleted

## 2020-02-14 ENCOUNTER — Emergency Department (HOSPITAL_BASED_OUTPATIENT_CLINIC_OR_DEPARTMENT_OTHER)
Admission: EM | Admit: 2020-02-14 | Discharge: 2020-02-14 | Disposition: A | Discharge status: Home / Self Care | Payer: Medicare Other | Attending: Emergency Medicine | Admitting: Emergency Medicine

## 2020-02-14 DIAGNOSIS — I10 Essential (primary) hypertension: Secondary | ICD-10-CM | POA: Insufficient documentation

## 2020-02-14 DIAGNOSIS — Z7982 Long term (current) use of aspirin: Secondary | ICD-10-CM | POA: Diagnosis not present

## 2020-02-14 DIAGNOSIS — Z7984 Long term (current) use of oral hypoglycemic drugs: Secondary | ICD-10-CM | POA: Diagnosis not present

## 2020-02-14 DIAGNOSIS — R300 Dysuria: Secondary | ICD-10-CM | POA: Insufficient documentation

## 2020-02-14 DIAGNOSIS — E119 Type 2 diabetes mellitus without complications: Secondary | ICD-10-CM | POA: Insufficient documentation

## 2020-02-14 DIAGNOSIS — Z79899 Other long term (current) drug therapy: Secondary | ICD-10-CM | POA: Diagnosis not present

## 2020-02-14 DIAGNOSIS — N939 Abnormal uterine and vaginal bleeding, unspecified: Secondary | ICD-10-CM | POA: Diagnosis not present

## 2020-02-14 LAB — COMPREHENSIVE METABOLIC PANEL
ALT: 10 U/L (ref 0–44)
AST: 19 U/L (ref 15–41)
Albumin: 3 g/dL — ABNORMAL LOW (ref 3.5–5.0)
Alkaline Phosphatase: 68 U/L (ref 38–126)
Anion gap: 15 (ref 5–15)
BUN: 33 mg/dL — ABNORMAL HIGH (ref 8–23)
CO2: 26 mmol/L (ref 22–32)
Calcium: 9.7 mg/dL (ref 8.9–10.3)
Chloride: 95 mmol/L — ABNORMAL LOW (ref 98–111)
Creatinine, Ser: 1.4 mg/dL — ABNORMAL HIGH (ref 0.44–1.00)
GFR, Estimated: 36 mL/min — ABNORMAL LOW (ref >60)
Glucose, Bld: 180 mg/dL — ABNORMAL HIGH (ref 70–99)
Potassium: 3.6 mmol/L (ref 3.5–5.1)
Sodium: 136 mmol/L (ref 135–145)
Total Bilirubin: 0.4 mg/dL (ref 0.3–1.2)
Total Protein: 6.9 g/dL (ref 6.5–8.1)

## 2020-02-14 LAB — CBC
HCT: 31.5 % — ABNORMAL LOW (ref 36.0–46.0)
Hemoglobin: 9.6 g/dL — ABNORMAL LOW (ref 12.0–15.0)
MCH: 26.6 pg (ref 26.0–34.0)
MCHC: 30.5 g/dL (ref 30.0–36.0)
MCV: 87.3 fL (ref 80.0–100.0)
Platelets: 382 10*3/uL (ref 150–400)
RBC: 3.61 MIL/uL — ABNORMAL LOW (ref 3.87–5.11)
RDW: 13 % (ref 11.5–15.5)
WBC: 13.2 10*3/uL — ABNORMAL HIGH (ref 4.0–10.5)
nRBC: 0 % (ref 0.0–0.2)

## 2020-02-14 LAB — OCCULT BLOOD X 1 CARD TO LAB, STOOL: Fecal Occult Bld: NEGATIVE

## 2020-02-14 NOTE — Discharge Instructions (Signed)
Continue your current antibiotic for the painful urination and also you may have intermittent bleeding but it is coming from the vagina.  No rectal bleeding at this time.  Blood counts are ok today.

## 2020-02-14 NOTE — ED Provider Notes (Signed)
[facility]ERGENCY DEPARTMENT Provider Note   CSN: [id number] Arrival date & time: 02/14/20  1703     History Chief Complaint  Patient presents with  . Rectal Bleeding    Christina Roberts is a 86 y.o. female.  Patient is a 86 year old female with a history of chronic back pain on oxycodone, diabetes, hypertension, intermittent bleeding which is unclear if it is in her stool or from her vagina for the last 1 month that has returned in the last few days and had a large episode today prior to arrival.  Patient reports that in the last 2 to 3 days she is also had significant dysuria and frequency.  Patient's daughter who is her caregiver reports that she has ongoing UTIs due to poor bladder control and did start cefdinir yesterday which has caused some diarrhea.  Patient reports she denies any abdominal pain.  She has no rectal pain.  She has not had any shortness of breath or syncope.  She denies any chest pain.  Patient does not take anticoagulation.  Last colonoscopy was over 10 years ago but she thinks it was normal at that time.  Patient did see her gynecologist this month after having the bleeding thinking it was vaginal but he reported he did not see any blood.  In December she has a follow-up with the urologist for further evaluation.  Patient has never had GI bleeding before.  The history is provided by the patient and a caregiver.  Rectal Bleeding      Past Medical History:  Diagnosis Date  . Closed compression fracture of body of L1 vertebra ([place])   . Diabetes mellitus without complication ([place])   . Diverticular disease   . Hiatal hernia   . Hypertension   . Ventral hernia     Patient Active Problem List   Diagnosis Date Noted  . Gastritis 10/16/2018  . Hiatal hernia   . Ventral hernia   . Diverticular disease   . Closed compression fracture of body of L1 vertebra ([place])   . UTI (urinary tract infection) 10/15/2018  . Diabetes ([place]) 10/15/2018  .  Essential hypertension 10/15/2018  . Chest pain 10/14/2018    Past Surgical History:  Procedure Laterality Date  . ABDOMINAL HYSTERECTOMY    . CHOLECYSTECTOMY    . HERNIA REPAIR       OB History   No obstetric history on file.     No family history on file.  Social History   Tobacco Use  . Smoking status: Never Smoker  . Smokeless tobacco: Never Used  Vaping Use  . Vaping Use: Never used  Substance Use Topics  . Alcohol use: Never  . Drug use: Never    Home Medications Prior to Admission medications   Medication Sig Start Date End Date Taking? Authorizing Provider  aspirin 81 MG chewable tablet Chew 81 mg by mouth daily.     [provider]  atorvastatin (LIPITOR) 20 MG tablet Take 20 mg by mouth daily.  09/14/18   [provider]  Cholecalciferol (VITAMIN D3) 50 MCG (2000 UT) TABS Take 1 tablet by mouth daily.    [provider]  diclofenac sodium (VOLTAREN) 1 % GEL Apply 2 g topically 4 (four) times daily.    [provider]  furosemide (LASIX) 40 MG tablet Take 40 mg by mouth daily.  10/10/18   [provider]  gabapentin (NEURONTIN) 100 MG capsule Take 100-300 mg by mouth at bedtime.  [provider]  HYDROcodone-acetaminophen (NORCO/VICODIN) 5-325 MG tablet Take 1 tablet by mouth every 6 (six) hours as needed for pain. 10/02/18   [provider]  metFORMIN (GLUCOPHAGE) 500 MG tablet Take 500 mg by mouth at bedtime.  08/16/18   [provider]  metoprolol succinate (TOPROL-XL) 25 MG 24 hr tablet Take 12.5 mg by mouth daily.  08/16/18   [provider]  Multiple Vitamins-Minerals (ONE DAILY MULTIVITAMIN WOMEN PO) Take 1 tablet by mouth daily.    [provider]  naproxen sodium (ALEVE) 220 MG tablet Take 220 mg by mouth as needed (pain).    [provider]  pantoprazole (PROTONIX) 40 MG tablet Take 1 tablet (40 mg total) by mouth 2 (two) times daily before a meal. Take 2 tabs  daily for 30 days then take 1 tab daily 10/17/18   Black, Lezlie Octave, NP  PARoxetine (PAXIL) 20 MG tablet Take 20 mg by mouth daily.  06/29/18   [provider]  PROBIOTIC PRODUCT PO Take 1 tablet by mouth daily.    [provider]  ramipril (ALTACE) 10 MG capsule Take 10 mg by mouth daily.  10/10/18   [provider]  Trolamine Salicylate (ASPERCREME EX) Apply 1 application topically daily.    [provider]    Allergies    Fosamax [alendronate sodium]  Review of Systems   Review of Systems  Gastrointestinal: Positive for hematochezia.  All other systems reviewed and are negative.   Physical Exam Updated Vital Signs BP (!) 115/59 (BP Location: Right Arm)   Pulse 82   Temp 98.6 F (37 C) (Oral)   Resp 18   Ht 5\' 6"  (1.676 m)   Wt 71.7 kg   SpO2 98%   BMI 25.50 kg/m   Physical Exam Vitals and nursing note reviewed.  Constitutional:      General: She is not in acute distress.    Appearance: Normal appearance. She is well-developed and normal weight.  HENT:     Head: Normocephalic and atraumatic.  Eyes:     Pupils: Pupils are equal, round, and reactive to light.     Comments: Pale conjunctive a  Cardiovascular:     Rate and Rhythm: Normal rate and regular rhythm.     Pulses: Normal pulses.     Heart sounds: Normal heart sounds. No murmur heard.  No friction rub.  Pulmonary:     Effort: Pulmonary effort is normal.     Breath sounds: Normal breath sounds. No wheezing or rales.  Abdominal:     General: Bowel sounds are normal. There is no distension.     Palpations: Abdomen is soft.     Tenderness: There is no abdominal tenderness. There is no right CVA tenderness, left CVA tenderness, guarding or rebound.  Genitourinary:    Comments: No hemorrhoids and stool is brown.  No obvious blood at the introitus or by external visualization of the vagina Musculoskeletal:        General: No tenderness. Normal range of motion.     Cervical back:  Normal range of motion and neck supple.     Right lower leg: No edema.     Left lower leg: No edema.     Comments: No edema  Skin:    General: Skin is warm and dry.     Coloration: Skin is pale.     Findings: No rash.  Neurological:     General: No focal deficit present.  Mental Status: She is alert and oriented to person, place, and time. Mental status is at baseline.     Cranial Nerves: No cranial nerve deficit.  Psychiatric:        Mood and Affect: Mood normal.        Behavior: Behavior normal.        Thought Content: Thought content normal.     ED Results / Procedures / Treatments   Labs (all labs ordered are listed, but only abnormal results are displayed) Labs Reviewed  CBC - Abnormal; Notable for the following components:      Result Value   WBC 13.2 (*)    RBC 3.61 (*)    Hemoglobin 9.6 (*)    HCT 31.5 (*)    All other components within normal limits  COMPREHENSIVE METABOLIC PANEL - Abnormal; Notable for the following components:   Chloride 95 (*)    Glucose, Bld 180 (*)    BUN 33 (*)    Creatinine, Ser 1.40 (*)    Albumin 3.0 (*)    GFR, Estimated 36 (*)    All other components within normal limits  OCCULT BLOOD X 1 CARD TO LAB, STOOL    EKG None  Radiology No results found.  Procedures Procedures (including critical care time)  Medications Ordered in ED Medications - No data to display  ED Course  I have reviewed the triage vital signs and the nursing notes.  Pertinent labs & imaging results that were available during my care of the patient were reviewed by me and considered in my medical decision making (see chart for details).    MDM Rules/Calculators/A&P                          Elderly female presenting today with bleeding either vaginal or rectal.  Patient reports this has been intermittent now for about a month.  Daughter reports that it did stop for approximately 3 weeks but then returned in the last few days.  They do have a picture  on their phone and there were dark blood clots mixed in with brown-colored stool.  Patient wears depends and appears to have a significant amount of blood soaked in the depends.  She has no abdominal pain denies any chest pain or shortness of breath.  Vital signs are reassuring and normal.  Concern for possible GI bleeding versus vaginal bleeding as her source.  Will check hemoglobin, chem 8, hemoccult and UA.  7:01 PM Hb today is 9.6 from 11 1 year ago but no other to compare.  Hemoccult neg here and pt has no obvious vaginal bleeding or rectal bleeding at this time.  Patient does have a leukocytosis of 13,000 but had recently started taking an antibiotic for concern for UTI for dysuria, frequency and urgency.  Patient is otherwise stable.  We will have her continue her antibiotic.  We will also have her continue follow-up with uro-Gyn as planned.  Pt's hemoccult neg and pt later visualize to have vaginal bleeding.  Pt does not want to wait for urine test and is already on abx so will have the pt f/u as planned and given return precautions.  MDM Number of Diagnoses or Management Options   Amount and/or Complexity of Data Reviewed Clinical lab tests: ordered and reviewed Decide to obtain previous medical records or to obtain history from someone other than the patient: yes Obtain history from someone other than the patient: yes Review and  summarize past medical records: yes Independent visualization of images, tracings, or specimens: yes  Risk of Complications, Morbidity, and/or Mortality Presenting problems: moderate Diagnostic procedures: low Management options: low  Patient Progress Patient progress: stable    Final Clinical Impression(s) / ED Diagnoses Final diagnoses:  Vaginal bleeding  Dysuria    Rx / DC Orders ED Discharge Orders    None       Blanchie Dessert, MD 02/14/20 2244

## 2020-02-14 NOTE — ED Triage Notes (Signed)
Pt c/o rectal bleeding x 2 weeks-also c/o "burning to my vagina"-denies vaginal bleeding-NAD-to triage in w/c

## 2020-02-14 NOTE — Telephone Encounter (Signed)
Pt's daughter states she was advised to take her mother to [place]. States she thought she was calling facility. Advised to follow PCPs advise.  Reason for Disposition . General information question, no triage required and triager able to answer question  Answer Assessment - Initial Assessment Questions 1. REASON FOR CALL or QUESTION: "What is your reason for calling today?" or "How can I best help you?" or "What question do you have that I can help answer?"    CAlled to [facility] for wait time.  Protocols used: INFORMATION ONLY CALL - NO TRIAGE-A-AH

## 2020-02-27 ENCOUNTER — Telehealth: Payer: Self-pay | Admitting: Internal Medicine

## 2020-02-27 NOTE — Telephone Encounter (Signed)
Called patient's daughter, [name], to schedule the Palliative Consult, no answer - left message with reason for call along with my name and call back number.

## 2020-02-27 NOTE — Telephone Encounter (Signed)
Returned call to daughter and discussed Palliative services and she was in agreement with scheduling visit.  I have scheduled an In-person Consult for 03/04/20 @ 9 AM.

## 2020-02-29 ENCOUNTER — Telehealth: Payer: Self-pay | Admitting: Internal Medicine

## 2020-02-29 NOTE — Telephone Encounter (Signed)
Rec'd message that daughter had called and wanted to know if we could make the Palliative visit later in the day, called daughter back with no answer.  Left message with daughter requesting a return call to reschedule visit.

## 2020-03-04 ENCOUNTER — Other Ambulatory Visit: Payer: Medicare Other | Admitting: Internal Medicine

## 2020-03-04 ENCOUNTER — Other Ambulatory Visit: Payer: Self-pay

## 2020-03-04 DIAGNOSIS — N950 Postmenopausal bleeding: Secondary | ICD-10-CM

## 2020-03-04 DIAGNOSIS — Z515 Encounter for palliative care: Secondary | ICD-10-CM

## 2020-03-04 NOTE — Progress Notes (Signed)
[facility] Consult Note Telephone: [phone]  Fax: [phone]  PATIENT NAME: Christina Roberts DOB: 04-Jun-1926 MRN: [id number]  PRIMARY CARE PROVIDER:   Lyman Bishop, DO  REFERRING PROVIDER:  Lyman Bishop, DO [place],  [place] [address]  RESPONSIBLE PARTYFonda Roberts Daughter   [phone]     RECOMMENDATIONS and PLAN:  Palliative care encounter  Z51.5  1.  Advance care planning:  Explanation of palliative and hospice care services to daughter and caregiver Christina Roberts.  Reviewed goals of care which are inclusive of continuing to reside in the home of her daughter and improve her strength.  Advanced directives were also reviewed.  Patient's delegation's were of DO NOT RESUSCITATE, DO NOT INTUBATE, limited additional interventions, antibiotics if indicated, trial IV fluids, and no feeding tube.  The MOST and DNR forms were completed and uploaded to [facility].. Documents were left in the home with patient's daughter for posting.  Palliative care will follow up with patient in approximately 2 weeks.  2.  Post menopausal vaginal bleeding: Keep pending appointment with [facility]/urologist on November 10.  Educated daughter on potential causes of bleeding.  Patient does not want escalation of treatment if cause of bleeding is related to cancer.  Continue to monitor daily for signs of bleeding and report same to gynecologist.  Evaluate CBC due to extreme fatigue and pale skin color.  Additional plans to be determined after above appointment.  3.  Chronic back pain: Encouraged turning and repositioning for comfort.  Continue hydrocodone 7.5/325 mg,, take 1 tablet if pain is 5/10 or greater and take 1/2 tablet if pain is less than 5/10.  Encouraged to not awaken patient to give analgesics.  Continue follow-up with pain management provider.  4.  Fatigue: Multifactorial as related to blood loss and daily use of opioids.   Activities with assistance only, add dietary sources of iron in meals.  Anemia panel lab draw evaluation and treat as necessary.   I spent 90 minutes providing this consultation,  from 0930 to 1100. More than 50% of the time in this consultation was spent coordinating communication with patient, daughter and caregiver(with pt's permission).   HISTORY OF PRESENT ILLNESS:  Christina Roberts is a 86 y.o. year old female with multiple medical problems including T2DM, chronic UTI's and intermittent heavy vaginal bleeding and chronic back pain. Daughter reports that bleeding has been bright red, inclusive of clots and greater than a normal menstrual cycle.  No current bleeding today. Palliative Care was asked to help address goals of care.   CODE STATUS:  DNAR/DNI  PPS: 40% weak  HOSPICE ELIGIBILITY/DIAGNOSIS: TBD  PAST MEDICAL HISTORY:  Past Medical History:  Diagnosis Date  . Closed compression fracture of body of L1 vertebra ([place])   . Diabetes mellitus without complication ([place])   . Diverticular disease   . Hiatal hernia   . Hypertension   . Ventral hernia     SOCIAL HX: Currently living with daughter.  4 children Social History   Tobacco Use  . Smoking status: Never Smoker  . Smokeless tobacco: Never Used  Substance Use Topics  . Alcohol use: Never    ALLERGIES:  Allergies  Allergen Reactions  . Fosamax [Alendronate Sodium] Swelling    Swelling of lips and nose     PERTINENT MEDICATIONS:  Outpatient Encounter Medications as of 03/04/2020  Medication Sig  . aspirin 81 MG chewable tablet Chew 81 mg by  mouth daily.   Marland Kitchen atorvastatin (LIPITOR) 20 MG tablet Take 20 mg by mouth daily.   . Cholecalciferol (VITAMIN D3) 50 MCG (2000 UT) TABS Take 1 tablet by mouth daily.  . diclofenac sodium (VOLTAREN) 1 % GEL Apply 2 g topically 4 (four) times daily.  . furosemide (LASIX) 40 MG tablet Take 40 mg by mouth daily.   Marland Kitchen gabapentin (NEURONTIN) 100 MG capsule Take 100-300 mg by mouth at  bedtime.  Marland Kitchen HYDROcodone-acetaminophen (NORCO/VICODIN) 5-325 MG tablet Take 1 tablet by mouth every 6 (six) hours as needed for pain.  . metFORMIN (GLUCOPHAGE) 500 MG tablet Take 500 mg by mouth at bedtime.   . metoprolol succinate (TOPROL-XL) 25 MG 24 hr tablet Take 12.5 mg by mouth daily.   . Multiple Vitamins-Minerals (ONE DAILY MULTIVITAMIN WOMEN PO) Take 1 tablet by mouth daily.  . naproxen sodium (ALEVE) 220 MG tablet Take 220 mg by mouth as needed (pain).  . pantoprazole (PROTONIX) 40 MG tablet Take 1 tablet (40 mg total) by mouth 2 (two) times daily before a meal. Take 2 tabs daily for 30 days then take 1 tab daily  . PARoxetine (PAXIL) 20 MG tablet Take 20 mg by mouth daily.   Marland Kitchen PROBIOTIC PRODUCT PO Take 1 tablet by mouth daily.  . ramipril (ALTACE) 10 MG capsule Take 10 mg by mouth daily.   Loura Pardon Salicylate (ASPERCREME EX) Apply 1 application topically daily.   No facility-administered encounter medications on file as of 03/04/2020.    PHYSICAL EXAM:   General: NAD, frail and fatigued appearing elderly female reclining in bed,  EENT: Pale mucosa Cardiovascular: regular rate and rhythm Pulmonary: clear ant fields Abdomen: soft, nontender, + bowel sounds GU: no suprapubic tenderness Extremities: no edema, no joint deformities Skin:pale in color.  Exposed skin is intact Neurological: Very drowsy.  Falls asleep mid sentence. Easily aroused. Oriented to person. Weakness   Christina Lex, NP-C   I

## 2020-03-06 ENCOUNTER — Telehealth: Payer: Self-pay

## 2020-03-06 NOTE — Telephone Encounter (Signed)
Received message to call patient's daughter regarding patient having hematuria s/p scope done on 03/05/20. Spoke with daugher, Christina Roberts, who stated that she heard from Dr. Zigmund Daniel office and that was told that bleeding is to be expected and to continue to monitor for clots. Christina Roberts also shared that she is encouraging fluids. Patient had 1/2 of her pain medication and is now resting. No further concerns at this time

## 2020-03-12 ENCOUNTER — Telehealth: Payer: Self-pay

## 2020-03-12 NOTE — Telephone Encounter (Signed)
Received message that patient's daughter, [name]. Spoke with [name] who shared that patient is having surgery on Friday to remove mesh from her bladder. Patient continues to have back pain and is using prescribed pain medication of hydrocodone 7.5 mg/325 mg. [name] noted that patient has increased confusion and visual hallucinations. She is questioning if this can be caused by the pain medication. [name] has a call out to the pain clinic who prescribed medication to see if patient can go back on lower dose. [name] also questioned if she should be giving her mom dulcolax. Discussed use of senna and risk of constipation r/t opiod use. Will update Enid Derry NP

## 2020-03-18 ENCOUNTER — Other Ambulatory Visit: Payer: Medicare Other | Admitting: Internal Medicine

## 2020-04-05 ENCOUNTER — Other Ambulatory Visit: Payer: Self-pay

## 2020-04-05 ENCOUNTER — Inpatient Hospital Stay (HOSPITAL_COMMUNITY)
Admission: EM | Admit: 2020-04-05 | Discharge: 2020-04-13 | DRG: 698 | Disposition: A | Discharge status: Home Health | Payer: Medicare Other | Attending: Internal Medicine | Admitting: Internal Medicine

## 2020-04-05 ENCOUNTER — Encounter (HOSPITAL_COMMUNITY): Payer: Self-pay | Admitting: Emergency Medicine

## 2020-04-05 DIAGNOSIS — Z7982 Long term (current) use of aspirin: Secondary | ICD-10-CM

## 2020-04-05 DIAGNOSIS — Z20822 Contact with and (suspected) exposure to covid-19: Secondary | ICD-10-CM | POA: Diagnosis present

## 2020-04-05 DIAGNOSIS — Y838 Other surgical procedures as the cause of abnormal reaction of the patient, or of later complication, without mention of misadventure at the time of the procedure: Secondary | ICD-10-CM | POA: Diagnosis present

## 2020-04-05 DIAGNOSIS — R627 Adult failure to thrive: Secondary | ICD-10-CM | POA: Diagnosis present

## 2020-04-05 DIAGNOSIS — Z7984 Long term (current) use of oral hypoglycemic drugs: Secondary | ICD-10-CM

## 2020-04-05 DIAGNOSIS — R5383 Other fatigue: Secondary | ICD-10-CM

## 2020-04-05 DIAGNOSIS — R410 Disorientation, unspecified: Secondary | ICD-10-CM | POA: Diagnosis not present

## 2020-04-05 DIAGNOSIS — I700 Atherosclerosis of aorta: Secondary | ICD-10-CM | POA: Diagnosis present

## 2020-04-05 DIAGNOSIS — E878 Other disorders of electrolyte and fluid balance, not elsewhere classified: Secondary | ICD-10-CM | POA: Diagnosis present

## 2020-04-05 DIAGNOSIS — E785 Hyperlipidemia, unspecified: Secondary | ICD-10-CM | POA: Diagnosis present

## 2020-04-05 DIAGNOSIS — R451 Restlessness and agitation: Secondary | ICD-10-CM | POA: Diagnosis not present

## 2020-04-05 DIAGNOSIS — I10 Essential (primary) hypertension: Secondary | ICD-10-CM | POA: Diagnosis present

## 2020-04-05 DIAGNOSIS — Z66 Do not resuscitate: Secondary | ICD-10-CM | POA: Diagnosis present

## 2020-04-05 DIAGNOSIS — D500 Iron deficiency anemia secondary to blood loss (chronic): Secondary | ICD-10-CM | POA: Diagnosis present

## 2020-04-05 DIAGNOSIS — Z23 Encounter for immunization: Secondary | ICD-10-CM

## 2020-04-05 DIAGNOSIS — E860 Dehydration: Secondary | ICD-10-CM | POA: Diagnosis present

## 2020-04-05 DIAGNOSIS — E119 Type 2 diabetes mellitus without complications: Secondary | ICD-10-CM

## 2020-04-05 DIAGNOSIS — N3000 Acute cystitis without hematuria: Secondary | ICD-10-CM

## 2020-04-05 DIAGNOSIS — Z9049 Acquired absence of other specified parts of digestive tract: Secondary | ICD-10-CM

## 2020-04-05 DIAGNOSIS — N390 Urinary tract infection, site not specified: Secondary | ICD-10-CM | POA: Diagnosis present

## 2020-04-05 DIAGNOSIS — E1165 Type 2 diabetes mellitus with hyperglycemia: Secondary | ICD-10-CM

## 2020-04-05 DIAGNOSIS — Z791 Long term (current) use of non-steroidal anti-inflammatories (NSAID): Secondary | ICD-10-CM

## 2020-04-05 DIAGNOSIS — D649 Anemia, unspecified: Secondary | ICD-10-CM | POA: Diagnosis present

## 2020-04-05 DIAGNOSIS — D631 Anemia in chronic kidney disease: Secondary | ICD-10-CM | POA: Diagnosis present

## 2020-04-05 DIAGNOSIS — E1122 Type 2 diabetes mellitus with diabetic chronic kidney disease: Secondary | ICD-10-CM | POA: Diagnosis present

## 2020-04-05 DIAGNOSIS — F05 Delirium due to known physiological condition: Secondary | ICD-10-CM | POA: Diagnosis present

## 2020-04-05 DIAGNOSIS — T83718A Erosion of other implanted mesh and other prosthetic materials to surrounding organ or tissue, initial encounter: Secondary | ICD-10-CM | POA: Diagnosis not present

## 2020-04-05 DIAGNOSIS — N1831 Chronic kidney disease, stage 3a: Secondary | ICD-10-CM | POA: Diagnosis present

## 2020-04-05 DIAGNOSIS — R54 Age-related physical debility: Secondary | ICD-10-CM | POA: Diagnosis present

## 2020-04-05 DIAGNOSIS — E871 Hypo-osmolality and hyponatremia: Secondary | ICD-10-CM | POA: Diagnosis present

## 2020-04-05 DIAGNOSIS — G9341 Metabolic encephalopathy: Secondary | ICD-10-CM | POA: Diagnosis present

## 2020-04-05 DIAGNOSIS — R4182 Altered mental status, unspecified: Secondary | ICD-10-CM

## 2020-04-05 DIAGNOSIS — Z90710 Acquired absence of both cervix and uterus: Secondary | ICD-10-CM

## 2020-04-05 DIAGNOSIS — Z79899 Other long term (current) drug therapy: Secondary | ICD-10-CM

## 2020-04-05 DIAGNOSIS — Z87440 Personal history of urinary (tract) infections: Secondary | ICD-10-CM

## 2020-04-05 DIAGNOSIS — B952 Enterococcus as the cause of diseases classified elsewhere: Secondary | ICD-10-CM | POA: Diagnosis present

## 2020-04-05 DIAGNOSIS — E876 Hypokalemia: Secondary | ICD-10-CM | POA: Diagnosis not present

## 2020-04-05 DIAGNOSIS — I129 Hypertensive chronic kidney disease with stage 1 through stage 4 chronic kidney disease, or unspecified chronic kidney disease: Secondary | ICD-10-CM | POA: Diagnosis present

## 2020-04-05 DIAGNOSIS — Z888 Allergy status to other drugs, medicaments and biological substances status: Secondary | ICD-10-CM

## 2020-04-05 DIAGNOSIS — R441 Visual hallucinations: Secondary | ICD-10-CM | POA: Diagnosis present

## 2020-04-05 LAB — COMPREHENSIVE METABOLIC PANEL
ALT: 13 U/L (ref 0–44)
AST: 20 U/L (ref 15–41)
Albumin: 2.6 g/dL — ABNORMAL LOW (ref 3.5–5.0)
Alkaline Phosphatase: 73 U/L (ref 38–126)
Anion gap: 11 (ref 5–15)
BUN: 19 mg/dL (ref 8–23)
CO2: 25 mmol/L (ref 22–32)
Calcium: 11.5 mg/dL — ABNORMAL HIGH (ref 8.9–10.3)
Chloride: 97 mmol/L — ABNORMAL LOW (ref 98–111)
Creatinine, Ser: 1.17 mg/dL — ABNORMAL HIGH (ref 0.44–1.00)
GFR, Estimated: 44 mL/min — ABNORMAL LOW (ref >60)
Glucose, Bld: 111 mg/dL — ABNORMAL HIGH (ref 70–99)
Potassium: 4.3 mmol/L (ref 3.5–5.1)
Sodium: 133 mmol/L — ABNORMAL LOW (ref 135–145)
Total Bilirubin: 0.6 mg/dL (ref 0.3–1.2)
Total Protein: 6.5 g/dL (ref 6.5–8.1)

## 2020-04-05 LAB — CBC WITH DIFFERENTIAL/PLATELET
Abs Immature Granulocytes: 0.06 10*3/uL (ref 0.00–0.07)
Basophils Absolute: 0 10*3/uL (ref 0.0–0.1)
Basophils Relative: 0 %
Eosinophils Absolute: 0.3 10*3/uL (ref 0.0–0.5)
Eosinophils Relative: 2 %
HCT: 27.2 % — ABNORMAL LOW (ref 36.0–46.0)
Hemoglobin: 8.1 g/dL — ABNORMAL LOW (ref 12.0–15.0)
Immature Granulocytes: 0 %
Lymphocytes Relative: 17 %
Lymphs Abs: 2.6 10*3/uL (ref 0.7–4.0)
MCH: 25 pg — ABNORMAL LOW (ref 26.0–34.0)
MCHC: 29.8 g/dL — ABNORMAL LOW (ref 30.0–36.0)
MCV: 84 fL (ref 80.0–100.0)
Monocytes Absolute: 1.2 10*3/uL — ABNORMAL HIGH (ref 0.1–1.0)
Monocytes Relative: 8 %
Neutro Abs: 10.6 10*3/uL — ABNORMAL HIGH (ref 1.7–7.7)
Neutrophils Relative %: 73 %
Platelets: 506 10*3/uL — ABNORMAL HIGH (ref 150–400)
RBC: 3.24 MIL/uL — ABNORMAL LOW (ref 3.87–5.11)
RDW: 13.7 % (ref 11.5–15.5)
WBC: 14.8 10*3/uL — ABNORMAL HIGH (ref 4.0–10.5)
nRBC: 0 % (ref 0.0–0.2)

## 2020-04-05 LAB — CBG MONITORING, ED: Glucose-Capillary: 109 mg/dL — ABNORMAL HIGH (ref 70–99)

## 2020-04-05 NOTE — ED Notes (Signed)
CBG 109. Triage RN aware

## 2020-04-05 NOTE — ED Triage Notes (Addendum)
Pt to ED via [facility] from home.  Family reported to EMS that pt has had increasing weakness over past 5 days.  Last night started having hallucinations   Family st's pt was like this in the past when she was septic Pt alert and oriented per norm.  Pt's family also st's pt is lethargic now due to her oxycodone given at 6:30 pm  Pt pale

## 2020-04-06 ENCOUNTER — Emergency Department (HOSPITAL_COMMUNITY): Payer: Medicare Other

## 2020-04-06 DIAGNOSIS — F05 Delirium due to known physiological condition: Secondary | ICD-10-CM | POA: Diagnosis present

## 2020-04-06 DIAGNOSIS — E878 Other disorders of electrolyte and fluid balance, not elsewhere classified: Secondary | ICD-10-CM | POA: Diagnosis present

## 2020-04-06 DIAGNOSIS — Z66 Do not resuscitate: Secondary | ICD-10-CM | POA: Diagnosis present

## 2020-04-06 DIAGNOSIS — T83718A Erosion of other implanted mesh and other prosthetic materials to surrounding organ or tissue, initial encounter: Secondary | ICD-10-CM | POA: Diagnosis present

## 2020-04-06 DIAGNOSIS — T83718D Erosion of other implanted mesh and other prosthetic materials to surrounding organ or tissue, subsequent encounter: Secondary | ICD-10-CM

## 2020-04-06 DIAGNOSIS — R410 Disorientation, unspecified: Secondary | ICD-10-CM | POA: Diagnosis present

## 2020-04-06 DIAGNOSIS — I10 Essential (primary) hypertension: Secondary | ICD-10-CM

## 2020-04-06 DIAGNOSIS — R441 Visual hallucinations: Secondary | ICD-10-CM | POA: Diagnosis present

## 2020-04-06 DIAGNOSIS — E860 Dehydration: Secondary | ICD-10-CM | POA: Diagnosis present

## 2020-04-06 DIAGNOSIS — B952 Enterococcus as the cause of diseases classified elsewhere: Secondary | ICD-10-CM | POA: Diagnosis present

## 2020-04-06 DIAGNOSIS — E119 Type 2 diabetes mellitus without complications: Secondary | ICD-10-CM

## 2020-04-06 DIAGNOSIS — E785 Hyperlipidemia, unspecified: Secondary | ICD-10-CM | POA: Diagnosis present

## 2020-04-06 DIAGNOSIS — G9341 Metabolic encephalopathy: Secondary | ICD-10-CM | POA: Diagnosis present

## 2020-04-06 DIAGNOSIS — N390 Urinary tract infection, site not specified: Secondary | ICD-10-CM | POA: Diagnosis present

## 2020-04-06 DIAGNOSIS — E871 Hypo-osmolality and hyponatremia: Secondary | ICD-10-CM | POA: Diagnosis present

## 2020-04-06 DIAGNOSIS — Z515 Encounter for palliative care: Secondary | ICD-10-CM | POA: Diagnosis not present

## 2020-04-06 DIAGNOSIS — E1122 Type 2 diabetes mellitus with diabetic chronic kidney disease: Secondary | ICD-10-CM | POA: Diagnosis present

## 2020-04-06 DIAGNOSIS — N3000 Acute cystitis without hematuria: Secondary | ICD-10-CM | POA: Diagnosis present

## 2020-04-06 DIAGNOSIS — D72829 Elevated white blood cell count, unspecified: Secondary | ICD-10-CM | POA: Diagnosis not present

## 2020-04-06 DIAGNOSIS — R627 Adult failure to thrive: Secondary | ICD-10-CM | POA: Diagnosis present

## 2020-04-06 DIAGNOSIS — D631 Anemia in chronic kidney disease: Secondary | ICD-10-CM | POA: Diagnosis present

## 2020-04-06 DIAGNOSIS — I129 Hypertensive chronic kidney disease with stage 1 through stage 4 chronic kidney disease, or unspecified chronic kidney disease: Secondary | ICD-10-CM | POA: Diagnosis present

## 2020-04-06 DIAGNOSIS — E876 Hypokalemia: Secondary | ICD-10-CM | POA: Diagnosis not present

## 2020-04-06 DIAGNOSIS — Z20822 Contact with and (suspected) exposure to covid-19: Secondary | ICD-10-CM | POA: Diagnosis present

## 2020-04-06 DIAGNOSIS — Z23 Encounter for immunization: Secondary | ICD-10-CM | POA: Diagnosis present

## 2020-04-06 DIAGNOSIS — D500 Iron deficiency anemia secondary to blood loss (chronic): Secondary | ICD-10-CM | POA: Diagnosis present

## 2020-04-06 DIAGNOSIS — D649 Anemia, unspecified: Secondary | ICD-10-CM

## 2020-04-06 DIAGNOSIS — R54 Age-related physical debility: Secondary | ICD-10-CM | POA: Diagnosis present

## 2020-04-06 DIAGNOSIS — R451 Restlessness and agitation: Secondary | ICD-10-CM | POA: Diagnosis not present

## 2020-04-06 DIAGNOSIS — N1831 Chronic kidney disease, stage 3a: Secondary | ICD-10-CM | POA: Diagnosis present

## 2020-04-06 DIAGNOSIS — Z7189 Other specified counseling: Secondary | ICD-10-CM | POA: Diagnosis not present

## 2020-04-06 DIAGNOSIS — Y838 Other surgical procedures as the cause of abnormal reaction of the patient, or of later complication, without mention of misadventure at the time of the procedure: Secondary | ICD-10-CM | POA: Diagnosis present

## 2020-04-06 LAB — FERRITIN: Ferritin: 56 ng/mL (ref 11–307)

## 2020-04-06 LAB — URINALYSIS, ROUTINE W REFLEX MICROSCOPIC
Bilirubin Urine: NEGATIVE
Glucose, UA: NEGATIVE mg/dL
Ketones, ur: NEGATIVE mg/dL
Nitrite: NEGATIVE
Protein, ur: 100 mg/dL — AB
RBC / HPF: >50 RBC/hpf — ABNORMAL HIGH (ref 0–5)
Specific Gravity, Urine: 1.012 (ref 1.005–1.030)
WBC, UA: >50 WBC/hpf — ABNORMAL HIGH (ref 0–5)
pH: 6 (ref 5.0–8.0)

## 2020-04-06 LAB — GLUCOSE, CAPILLARY
Glucose-Capillary: 83 mg/dL (ref 70–99)
Glucose-Capillary: 78 mg/dL (ref 70–99)
Glucose-Capillary: 80 mg/dL (ref 70–99)

## 2020-04-06 LAB — CBC
HCT: 27 % — ABNORMAL LOW (ref 36.0–46.0)
Hemoglobin: 8.4 g/dL — ABNORMAL LOW (ref 12.0–15.0)
MCH: 25.8 pg — ABNORMAL LOW (ref 26.0–34.0)
MCHC: 31.1 g/dL (ref 30.0–36.0)
MCV: 82.8 fL (ref 80.0–100.0)
Platelets: 485 10*3/uL — ABNORMAL HIGH (ref 150–400)
RBC: 3.26 MIL/uL — ABNORMAL LOW (ref 3.87–5.11)
RDW: 13.6 % (ref 11.5–15.5)
WBC: 14.1 10*3/uL — ABNORMAL HIGH (ref 4.0–10.5)
nRBC: 0 % (ref 0.0–0.2)

## 2020-04-06 LAB — TYPE AND SCREEN
ABO/RH(D): O POS
Antibody Screen: NEGATIVE

## 2020-04-06 LAB — VITAMIN B12: Vitamin B-12: 359 pg/mL (ref 180–914)

## 2020-04-06 LAB — BASIC METABOLIC PANEL
Anion gap: 10 (ref 5–15)
BUN: 17 mg/dL (ref 8–23)
CO2: 26 mmol/L (ref 22–32)
Calcium: 11.7 mg/dL — ABNORMAL HIGH (ref 8.9–10.3)
Chloride: 99 mmol/L (ref 98–111)
Creatinine, Ser: 1.19 mg/dL — ABNORMAL HIGH (ref 0.44–1.00)
GFR, Estimated: 43 mL/min — ABNORMAL LOW (ref >60)
Glucose, Bld: 100 mg/dL — ABNORMAL HIGH (ref 70–99)
Potassium: 4.5 mmol/L (ref 3.5–5.1)
Sodium: 135 mmol/L (ref 135–145)

## 2020-04-06 LAB — CBG MONITORING, ED
Glucose-Capillary: 102 mg/dL — ABNORMAL HIGH (ref 70–99)
Glucose-Capillary: 95 mg/dL (ref 70–99)

## 2020-04-06 LAB — RESP PANEL BY RT-PCR (FLU A&B, COVID) ARPGX2
Influenza A by PCR: NEGATIVE
Influenza B by PCR: NEGATIVE
SARS Coronavirus 2 by RT PCR: NEGATIVE

## 2020-04-06 LAB — IRON AND TIBC
Iron: 20 ug/dL — ABNORMAL LOW (ref 28–170)
Saturation Ratios: 8 % — ABNORMAL LOW (ref 10.4–31.8)
TIBC: 241 ug/dL — ABNORMAL LOW (ref 250–450)
UIBC: 221 ug/dL

## 2020-04-06 LAB — RETICULOCYTES
Immature Retic Fract: 16.5 % — ABNORMAL HIGH (ref 2.3–15.9)
RBC.: 3.24 MIL/uL — ABNORMAL LOW (ref 3.87–5.11)
Retic Count, Absolute: 57 10*3/uL (ref 19.0–186.0)
Retic Ct Pct: 1.8 % (ref 0.4–3.1)

## 2020-04-06 LAB — ABO/RH: ABO/RH(D): O POS

## 2020-04-06 LAB — HEMOGLOBIN A1C
Hgb A1c MFr Bld: 5.5 % (ref 4.8–5.6)
Mean Plasma Glucose: 111.15 mg/dL

## 2020-04-06 LAB — FOLATE: Folate: 68.6 ng/mL (ref >5.9)

## 2020-04-06 LAB — AMMONIA: Ammonia: <9 umol/L — ABNORMAL LOW (ref 9–35)

## 2020-04-06 LAB — TSH: TSH: 4.449 u[IU]/mL (ref 0.350–4.500)

## 2020-04-06 LAB — POC OCCULT BLOOD, ED: Fecal Occult Bld: NEGATIVE

## 2020-04-06 MED ORDER — ONDANSETRON HCL 4 MG/2ML IJ SOLN: 4.0000 mg | Freq: Four times a day (QID) | INTRAMUSCULAR | Status: DC | PRN

## 2020-04-06 MED ORDER — ACETAMINOPHEN 650 MG RE SUPP: 650.0000 mg | Freq: Four times a day (QID) | RECTAL | Status: DC | PRN

## 2020-04-06 MED ORDER — ATORVASTATIN CALCIUM 10 MG PO TABS
20.0000 mg | ORAL_TABLET | Freq: Every day | ORAL | Status: DC
  Administered 2020-04-06 – 2020-04-13 (×8): 20 mg via ORAL
  Filled 2020-04-06 (×8): qty 2

## 2020-04-06 MED ORDER — SODIUM CHLORIDE 0.9 % IV SOLN
1.0000 g | INTRAVENOUS | Status: DC
  Administered 2020-04-07 – 2020-04-08 (×2): 1 g via INTRAVENOUS
  Filled 2020-04-06 (×3): qty 10

## 2020-04-06 MED ORDER — PAROXETINE HCL 20 MG PO TABS
20.0000 mg | ORAL_TABLET | Freq: Every day | ORAL | Status: DC
  Administered 2020-04-06 – 2020-04-13 (×8): 20 mg via ORAL
  Filled 2020-04-06 (×8): qty 1

## 2020-04-06 MED ORDER — SODIUM CHLORIDE 0.9 % IV SOLN
1.0000 g | Freq: Once | INTRAVENOUS | Status: AC
  Administered 2020-04-06: 1 g via INTRAVENOUS
  Filled 2020-04-06: qty 10

## 2020-04-06 MED ORDER — HYDROCODONE-ACETAMINOPHEN 7.5-325 MG PO TABS
0.5000 | ORAL_TABLET | Freq: Four times a day (QID) | ORAL | Status: DC | PRN
  Administered 2020-04-09: 0.5 via ORAL
  Filled 2020-04-06: qty 1

## 2020-04-06 MED ORDER — HALOPERIDOL LACTATE 5 MG/ML IJ SOLN: 2.0000 mg | Freq: Once | INTRAMUSCULAR | Status: DC

## 2020-04-06 MED ORDER — LACTATED RINGERS IV SOLN: INTRAVENOUS | Status: DC

## 2020-04-06 MED ORDER — INSULIN ASPART 100 UNIT/ML ~~LOC~~ SOLN: 0.0000 [IU] | Freq: Three times a day (TID) | SUBCUTANEOUS | Status: DC

## 2020-04-06 MED ORDER — ACETAMINOPHEN 325 MG PO TABS: 650.0000 mg | ORAL_TABLET | Freq: Four times a day (QID) | ORAL | Status: DC | PRN

## 2020-04-06 MED ORDER — METOPROLOL SUCCINATE ER 25 MG PO TB24
12.5000 mg | ORAL_TABLET | Freq: Every day | ORAL | Status: DC
  Administered 2020-04-06 – 2020-04-13 (×8): 12.5 mg via ORAL
  Filled 2020-04-06 (×8): qty 1

## 2020-04-06 MED ORDER — ONDANSETRON HCL 4 MG PO TABS: 4.0000 mg | ORAL_TABLET | Freq: Four times a day (QID) | ORAL | Status: DC | PRN

## 2020-04-06 MED ORDER — MELATONIN 5 MG PO TABS
5.0000 mg | ORAL_TABLET | Freq: Every evening | ORAL | Status: DC | PRN
  Filled 2020-04-06: qty 1

## 2020-04-06 MED ORDER — HALOPERIDOL LACTATE 5 MG/ML IJ SOLN: 1.0000 mg | Freq: Four times a day (QID) | INTRAMUSCULAR | Status: DC | PRN

## 2020-04-06 MED ORDER — ASPIRIN 81 MG PO CHEW
81.0000 mg | CHEWABLE_TABLET | Freq: Every day | ORAL | Status: DC
  Administered 2020-04-06 – 2020-04-13 (×8): 81 mg via ORAL
  Filled 2020-04-06 (×8): qty 1

## 2020-04-06 MED ORDER — HALOPERIDOL LACTATE 5 MG/ML IJ SOLN
2.0000 mg | Freq: Four times a day (QID) | INTRAMUSCULAR | Status: DC | PRN
  Administered 2020-04-06: 2 mg via INTRAVENOUS
  Filled 2020-04-06: qty 1

## 2020-04-06 NOTE — ED Notes (Signed)
Ordered Breakfast

## 2020-04-06 NOTE — Progress Notes (Signed)
Patient seen and examined, admitted earlier this morning by Dr. Alcario Drought, please see his H&P for details Mr. 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 Presented to the ED with generalized weakness, hallucinations and confusion which started 5 days prior. -continue IV Ceftriaxone, IVF today -Haldol PRN for Agitation   Domenic Polite, MD

## 2020-04-06 NOTE — ED Notes (Signed)
Applied warm compress to right arm to assist with swelling from infiltration.

## 2020-04-06 NOTE — ED Notes (Signed)
Provider at bedside

## 2020-04-06 NOTE — ED Provider Notes (Signed)
[facility] EMERGENCY DEPARTMENT Provider Note   CSN: [id number] Arrival date & time: 04/05/20  2145     History Chief Complaint  Patient presents with  . Generalized Weakness    Christina Roberts is a 86 y.o. female.  The history is provided by the patient and medical records. No language interpreter was used.  Altered Mental Status Presenting symptoms: confusion and disorientation   Severity:  Moderate Most recent episode:  More than 2 days ago Episode history:  Continuous Timing:  Constant Progression:  Waxing and waning Chronicity:  Recurrent Context: not dementia and not head injury   Associated symptoms: decreased appetite and hallucinations   Associated symptoms: no abdominal pain, no agitation, no fever, no headaches, no light-headedness, no nausea, no palpitations, no rash, no seizures, no slurred speech and no vomiting        Past Medical History:  Diagnosis Date  . Closed compression fracture of body of L1 vertebra ([place])   . Diabetes mellitus without complication ([place])   . Diverticular disease   . Hiatal hernia   . Hypertension   . Ventral hernia     Patient Active Problem List   Diagnosis Date Noted  . Gastritis 10/16/2018  . Hiatal hernia   . Ventral hernia   . Diverticular disease   . Closed compression fracture of body of L1 vertebra ([place])   . UTI (urinary tract infection) 10/15/2018  . Diabetes ([place]) 10/15/2018  . Essential hypertension 10/15/2018  . Chest pain 10/14/2018    Past Surgical History:  Procedure Laterality Date  . ABDOMINAL HYSTERECTOMY    . CHOLECYSTECTOMY    . HERNIA REPAIR       OB History   No obstetric history on file.     No family history on file.  Social History   Tobacco Use  . Smoking status: Never Smoker  . Smokeless tobacco: Never Used  Vaping Use  . Vaping Use: Never used  Substance Use Topics  . Alcohol use: Never  . Drug use: Never    Home Medications Prior to Admission  medications   Medication Sig Start Date End Date Taking? Authorizing Provider  aspirin 81 MG chewable tablet Chew 81 mg by mouth daily.     [provider]  atorvastatin (LIPITOR) 20 MG tablet Take 20 mg by mouth daily.  09/14/18   [provider]  Cholecalciferol (VITAMIN D3) 50 MCG (2000 UT) TABS Take 1 tablet by mouth daily.    [provider]  diclofenac sodium (VOLTAREN) 1 % GEL Apply 2 g topically 4 (four) times daily.    [provider]  furosemide (LASIX) 40 MG tablet Take 40 mg by mouth daily.  10/10/18   [provider]  gabapentin (NEURONTIN) 100 MG capsule Take 100-300 mg by mouth at bedtime.    [provider]  HYDROcodone-acetaminophen (NORCO/VICODIN) 5-325 MG tablet Take 1 tablet by mouth every 6 (six) hours as needed for pain. 10/02/18   [provider]  metFORMIN (GLUCOPHAGE) 500 MG tablet Take 500 mg by mouth at bedtime.  08/16/18   [provider]  metoprolol succinate (TOPROL-XL) 25 MG 24 hr tablet Take 12.5 mg by mouth daily.  08/16/18   [provider]  Multiple Vitamins-Minerals (ONE DAILY MULTIVITAMIN WOMEN PO) Take 1 tablet by mouth daily.    [provider]  naproxen sodium (ALEVE) 220 MG tablet Take 220 mg by mouth as needed (pain).    [provider]  pantoprazole ([place])  40 MG tablet Take 1 tablet (40 mg total) by mouth 2 (two) times daily before a meal. Take 2 tabs daily for 30 days then take 1 tab daily 10/17/18   Black, Lezlie Octave, NP  PARoxetine (PAXIL) 20 MG tablet Take 20 mg by mouth daily.  06/29/18   [provider]  PROBIOTIC PRODUCT PO Take 1 tablet by mouth daily.    [provider]  ramipril (ALTACE) 10 MG capsule Take 10 mg by mouth daily.  10/10/18   [provider]  Trolamine Salicylate (ASPERCREME EX) Apply 1 application topically daily.    [provider]    Allergies    Fosamax [alendronate sodium]  Review of Systems    Review of Systems  Constitutional: Positive for decreased appetite and fatigue. Negative for chills, diaphoresis and fever.  HENT: Negative for congestion.   Eyes: Negative for visual disturbance.  Respiratory: Positive for cough. Negative for chest tightness, shortness of breath and wheezing.   Cardiovascular: Negative for chest pain, palpitations and leg swelling.  Gastrointestinal: Positive for constipation. Negative for abdominal pain, diarrhea, nausea and vomiting.  Genitourinary: Positive for decreased urine volume. Negative for flank pain and frequency.  Musculoskeletal: Negative for back pain, neck pain and neck stiffness.  Skin: Negative for rash and wound.  Neurological: Negative for seizures, light-headedness, numbness and headaches.  Psychiatric/Behavioral: Positive for confusion and hallucinations. Negative for agitation.  All other systems reviewed and are negative.   Physical Exam Updated Vital Signs BP (!) 117/59 (BP Location: Left Arm)   Pulse 83   Temp 98.9 F (37.2 C) (Oral)   Resp 15   Ht 5' 6" (1.676 m)   Wt 59 kg   SpO2 96%   BMI 20.98 kg/m   Physical Exam Vitals and nursing note reviewed.  Constitutional:      General: She is not in acute distress.    Appearance: She is well-developed and well-nourished. She is not ill-appearing, toxic-appearing or diaphoretic.  HENT:     Head: Normocephalic and atraumatic.     Right Ear: External ear normal.     Left Ear: External ear normal.     Nose: Nose normal. No rhinorrhea.     Mouth/Throat:     Mouth: Oropharynx is clear and moist. Mucous membranes are dry.     Pharynx: No oropharyngeal exudate or posterior oropharyngeal erythema.  Eyes:     Extraocular Movements: EOM normal.     Conjunctiva/sclera: Conjunctivae normal.     Pupils: Pupils are equal, round, and reactive to light.  Cardiovascular:     Rate and Rhythm: Normal rate.     Pulses: Normal pulses.     Heart sounds: No murmur  heard.   Pulmonary:     Effort: Pulmonary effort is normal. No respiratory distress.     Breath sounds: No stridor. Rhonchi present. No wheezing or rales.  Chest:     Chest wall: No tenderness.  Abdominal:     General: Abdomen is flat. There is no distension.     Tenderness: There is no abdominal tenderness. There is no right CVA tenderness, left CVA tenderness, guarding or rebound.  Musculoskeletal:        General: No tenderness.     Cervical back: Normal range of motion and neck supple. No tenderness.     Right lower leg: No edema.     Left lower leg: No edema.  Skin:    General: Skin is warm.     Capillary Refill:  Capillary refill takes less than 2 seconds.     Coloration: Skin is pale.     Findings: No erythema or rash.  Neurological:     Mental Status: She is alert and oriented to person, place, and time.     Sensory: No sensory deficit.     Motor: No weakness or abnormal muscle tone.     Deep Tendon Reflexes: Reflexes are normal and symmetric.  Psychiatric:        Attention and Perception: She perceives visual (intermittnet) hallucinations.     ED Results / Procedures / Treatments   Labs (all labs ordered are listed, but only abnormal results are displayed) Labs Reviewed  CBC WITH DIFFERENTIAL/PLATELET - Abnormal; Notable for the following components:      Result Value   WBC 14.8 (*)    RBC 3.24 (*)    Hemoglobin 8.1 (*)    HCT 27.2 (*)    MCH 25.0 (*)    MCHC 29.8 (*)    Platelets 506 (*)    Neutro Abs 10.6 (*)    Monocytes Absolute 1.2 (*)    All other components within normal limits  COMPREHENSIVE METABOLIC PANEL - Abnormal; Notable for the following components:   Sodium 133 (*)    Chloride 97 (*)    Glucose, Bld 111 (*)    Creatinine, Ser 1.17 (*)    Calcium 11.5 (*)    Albumin 2.6 (*)    GFR, Estimated 44 (*)    All other components within normal limits  URINALYSIS, ROUTINE W REFLEX MICROSCOPIC - Abnormal; Notable for the following components:    Color, Urine AMBER (*)    APPearance TURBID (*)    Hgb urine dipstick MODERATE (*)    Protein, ur 100 (*)    Leukocytes,Ua LARGE (*)    RBC / HPF >50 (*)    WBC, UA >50 (*)    Bacteria, UA MANY (*)    Non Squamous Epithelial 0-5 (*)    All other components within normal limits  AMMONIA - Abnormal; Notable for the following components:   Ammonia <9 (*)    All other components within normal limits  RETICULOCYTES - Abnormal; Notable for the following components:   RBC. 3.24 (*)    Immature Retic Fract 16.5 (*)    All other components within normal limits  CBC - Abnormal; Notable for the following components:   WBC 14.1 (*)    RBC 3.26 (*)    Hemoglobin 8.4 (*)    HCT 27.0 (*)    MCH 25.8 (*)    Platelets 485 (*)    All other components within normal limits  CBG MONITORING, ED - Abnormal; Notable for the following components:   Glucose-Capillary 109 (*)    All other components within normal limits  RESP PANEL BY RT-PCR (FLU A&B, COVID) ARPGX2  URINE CULTURE  HEMOGLOBIN A1C  TSH  VITAMIN B12  FOLATE  IRON AND TIBC  FERRITIN  BASIC METABOLIC PANEL  POC OCCULT BLOOD, ED  TYPE AND SCREEN  ABO/RH    EKG EKG Interpretation  Date/Time:  Sunday April 06 2020 00:36:53 EST Ventricular Rate:  82 PR Interval:    QRS Duration: 132 QT Interval:  396 QTC Calculation: 463 R Axis:   33 Text Interpretation: Sinus or ectopic atrial rhythm Right bundle branch block When compared to prior, similar appreance. No STEMI Confirmed by Antony Blackbird [phone]) on 04/06/2020 12:44:37 AM   Radiology DG Chest [facility] 1 View  Result  Date: 04/06/2020 CLINICAL DATA:  Hallucinations EXAM: PORTABLE CHEST 1 VIEW COMPARISON:  October 14, 2018 FINDINGS: The heart size and mediastinal contours are within normal limits. Both lungs are clear. The visualized skeletal structures are unremarkable. Aortic calcifications are again noted. IMPRESSION: No active disease. Electronically Signed   By: Constance Holster M.D.   On: 04/06/2020 01:14    Procedures Procedures (including critical care time)  CRITICAL CARE Performed by: Gwenyth Allegra  Total critical care time: 30 minutes Critical care time was exclusive of separately billable procedures and treating other patients. Critical care was necessary to treat or prevent imminent or life-threatening deterioration. Critical care was time spent personally by me on the following activities: development of treatment plan with patient and/or surrogate as well as nursing, discussions with consultants, evaluation of patient's response to treatment, examination of patient, obtaining history from patient or surrogate, ordering and performing treatments and interventions, ordering and review of laboratory studies, ordering and review of radiographic studies, pulse oximetry and re-evaluation of patient's condition.  Medications Ordered in ED Medications  insulin aspart (novoLOG) injection 0-9 Units (has no administration in time range)  cefTRIAXone (ROCEPHIN) 1 g in sodium chloride 0.9 % 100 mL IVPB (has no administration in time range)  lactated ringers infusion (has no administration in time range)  haloperidol lactate (HALDOL) injection 2 mg (2 mg Intravenous Given 04/06/20 0501)  acetaminophen (TYLENOL) tablet 650 mg (has no administration in time range)    Or  acetaminophen (TYLENOL) suppository 650 mg (has no administration in time range)  ondansetron (ZOFRAN) tablet 4 mg (has no administration in time range)    Or  ondansetron (ZOFRAN) injection 4 mg (has no administration in time range)  cefTRIAXone (ROCEPHIN) 1 g in sodium chloride 0.9 % 100 mL IVPB (1 g Intravenous New Bag/Given 04/06/20 0431)    ED Course  I have reviewed the triage vital signs and the nursing notes.  Pertinent labs & imaging results that were available during my care of the patient were reviewed by me and considered in my medical decision making (see chart for  details).    MDM Rules/Calculators/A&P                          Christina Roberts is a 86 y.o. female with a past medical history significant for hypertension, diabetes, diverticular disease, chronic back pain, prior hysterectomy, and evaluation last month for postmenopausal vaginal bleeding who presents for 5 days of fatigue, confusion, disorientation, visual hallucinations, occasional dry cough, urine looking darker, and overall weakness.  According to family report to nursing, symptoms are similar to when patient has had infection in the past.  They report that she is also had some titration to her pain medication that seems to making her more tired as well.  Patient says that for the last week she noticed that she has been more confused and disoriented and says that she has been seeing people that were not there.  She denies any abdominal pain or chest pain and denies shortness of breath or palpitations.  She does report she had a dry cough but no fevers or chills.  She reports she has had some urine looking darker but denies dysuria.  She reports some constipation and denies diarrhea now.  She reports last month she did have some vaginal bleeding which she reports has resolved and she has not seen any dark stools or vaginal bleeding recurrent.  She denies any new trauma  and denies any headaches.  She denies any focal neurologic deficits that are new.  She is resting but just feels tired.  On exam, lungs are slightly coarse.  Chest and abdomen are nontender.  Bowel sounds were appreciated.  She had normal sensation and strength in extremities.  She is pale appearing.  Pupils are symmetric reactive normal extraocular movements.  She is oriented to person but disoriented to place.  Clinically I am concerned about acute delirium given the patient's confusion, hallucinations, and disorientation.  This is reportedly not her baseline.  Given the cough and urinary changes, we will get work-up to look for  occult infection.  With her fatigue and history of recent bleeding, we will get other blood work.  Patient had screening labs in triage initially.  She had a CBC which showed leukocytosis of 14.8 which is increased from prior and hemoglobin that is lower at 8.1 from prior.  Metabolic panel shows mild hyponatremia and hypochloremia but otherwise creatinine is improved from prior.  AST ALT and alk phos not elevated.  We will get other labs including TSH, ammonia, urinalysis, Covid test, will get chest x-ray, and will get fecal occult test given the downtrending hemoglobin.  Patient reports he has not had any further vaginal bleeding so we will hold on further pelvic exam initially.   4:05 AM Work-up continue to return.  Covid and flu test negative.  Urinalysis does show leukocytes, bacteria, and appeared very infected on nursing evaluation.  I suspect this is the source of the patient's delirium with UTI.  Will give Rocephin.  Otherwise, ammonia not elevated, chest x-ray shows no pneumonia, and occult test was collected and is still in process.  We will admit patient for delirium with fatigue and confusion and altered mental status in the setting of UTI.  Also suspect there is some worsening anemia but waiting on fecal occult test.  She denies any abdominal pain at this time.  Patient will be admitted.    Final Clinical Impression(s) / ED Diagnoses Final diagnoses:  Delirium  Acute cystitis without hematuria  Anemia, unspecified type  Fatigue, unspecified type     Clinical Impression: 1. Delirium   2. AMS (altered mental status)   3. Acute cystitis without hematuria   4. Anemia, unspecified type   5. Fatigue, unspecified type     Disposition: Admit  This note was prepared with assistance of Dragon voice recognition software. Occasional wrong-word or sound-a-like substitutions may have occurred due to the inherent limitations of voice recognition software.     , Gwenyth Allegra, MD 04/06/20 [phone]

## 2020-04-06 NOTE — ED Notes (Signed)
md at bedside

## 2020-04-06 NOTE — ED Notes (Signed)
First contact. Change of shift. Pt resting in bed. Pleasantly confused. Otherwise no acute distress noted. Attempted reorientation. 22g placed into right hand infiltrated. Removed line.

## 2020-04-06 NOTE — H&P (Signed)
History and Physical    Christina Roberts HQI:[id number] DOB: 07-16-1926 DOA: 04/05/2020  PCP: Lyman Bishop, DO  Patient coming from: Home  I have personally briefly reviewed patient's old medical records in [place]  Chief Complaint: AMS  HPI: Christina Roberts is a 86 y.o. 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  Pt presents to ED with generalized weakness over past 5 days per family, last evening started having hallucinations.  Symptoms are persistent and worsening.  Symptoms are similar to when she had sepsis in past.  Pt most recently admitted to [facility] just last month for encephalopathy due to UTI and hematuria.  Urine ultimately grew 100k CFU of pan-sensitive E.Coli.  Followed up with Urology on 12/1 - decision made not to pursue surgery.  They put pt on 14 day course of BID keflex.  Pt unable to contribute to history due to severe delirium and active hallucinations.   ED Course: UA confirms UTI.  WBC 14.8k, No other SIRS at this time.  HGB 8.1 down slightly from 8.4 on discharge from [facility] 11/22.  No Gross hematuria today.  Creat 1.17 is about her baseline of 1.2.   Review of Systems: Unable to perform due to severe AMS  Past Medical History:  Diagnosis Date  . Closed compression fracture of body of L1 vertebra ([place])   . Diabetes mellitus without complication ([place])   . Diverticular disease   . Hiatal hernia   . Hypertension   . Ventral hernia     Past Surgical History:  Procedure Laterality Date  . ABDOMINAL HYSTERECTOMY    . CHOLECYSTECTOMY    . HERNIA REPAIR       reports that she has never smoked. She has never used smokeless tobacco. She reports that she does not drink alcohol and does not use drugs.  Allergies  Allergen Reactions  . Flomax [Tamsulosin Hcl] Swelling  . Fosamax [Alendronate Sodium] Swelling    Swelling of lips and nose  . Nsaids Other (See Comments)    No  family history on file. Unable to obtain due to severe AMS.  Prior to Admission medications   Medication Sig Start Date End Date Taking? Authorizing Provider  aspirin 81 MG chewable tablet Chew 81 mg by mouth daily.     [provider]  atorvastatin (LIPITOR) 20 MG tablet Take 20 mg by mouth daily.  09/14/18   [provider]  Cholecalciferol (VITAMIN D3) 50 MCG (2000 UT) TABS Take 1 tablet by mouth daily.    [provider]  diclofenac sodium (VOLTAREN) 1 % GEL Apply 2 g topically 4 (four) times daily.    [provider]  furosemide (LASIX) 40 MG tablet Take 40 mg by mouth daily.  10/10/18   [provider]  gabapentin (NEURONTIN) 100 MG capsule Take 100-300 mg by mouth at bedtime.    [provider]  HYDROcodone-acetaminophen (NORCO/VICODIN) 5-325 MG tablet Take 1 tablet by mouth every 6 (six) hours as needed for pain. 10/02/18   [provider]  metFORMIN (GLUCOPHAGE) 500 MG tablet Take 500 mg by mouth at bedtime.  08/16/18   [provider]  metoprolol succinate (TOPROL-XL) 25 MG 24 hr tablet Take 12.5 mg by mouth daily.  08/16/18   [provider]  Multiple Vitamins-Minerals (ONE DAILY MULTIVITAMIN WOMEN PO) Take 1 tablet by mouth daily.    [provider]  naproxen sodium (ALEVE) 220 MG tablet Take  220 mg by mouth as needed (pain).    [provider]  PARoxetine (PAXIL) 20 MG tablet Take 20 mg by mouth daily.  06/29/18   [provider]  PROBIOTIC PRODUCT PO Take 1 tablet by mouth daily.    [provider]  ramipril (ALTACE) 10 MG capsule Take 10 mg by mouth daily.  10/10/18   [provider]  Trolamine Salicylate (ASPERCREME EX) Apply 1 application topically daily.    [provider]    Physical Exam: Vitals:   04/06/20 0148 04/06/20 0226 04/06/20 0230 04/06/20 0432  BP:  120/84 131/81   Pulse:  84 87   Resp:  18 19   Temp:    98.9 F (37.2 C)  TempSrc:     Oral  SpO2: 99% 98% 98%   Weight:      Height:        Constitutional: Confused, hallucinating.  Pale. Eyes: PERRL, lids and conjunctivae normal ENMT: Mucous membranes are moist. Posterior pharynx clear of any exudate or lesions.Normal dentition.  Neck: normal, supple, no masses, no thyromegaly Respiratory: clear to auscultation bilaterally, no wheezing, no crackles. Normal respiratory effort. No accessory muscle use.  Cardiovascular: Regular rate and rhythm, no murmurs / rubs / gallops. No extremity edema. 2+ pedal pulses. No carotid bruits.  Abdomen: no tenderness, no masses palpated. No hepatosplenomegaly. Bowel sounds positive.  Musculoskeletal: no clubbing / cyanosis. No joint deformity upper and lower extremities. Good ROM, no contractures. Normal muscle tone.  Skin: no rashes, lesions, ulcers. No induration Neurologic: MAE, no obvious gross deficits. Psychiatric: Confused, hallucinating   Labs on Admission: I have personally reviewed following labs and imaging studies  CBC: Recent Labs  Lab 04/05/20 2206  WBC 14.8*  NEUTROABS 10.6*  HGB 8.1*  HCT 27.2*  MCV 84.0  PLT 932*   Basic Metabolic Panel: Recent Labs  Lab 04/05/20 2206  NA 133*  K 4.3  CL 97*  CO2 25  GLUCOSE 111*  BUN 19  CREATININE 1.17*  CALCIUM 11.5*   GFR: Estimated Creatinine Clearance: 29.2 mL/min (A) (by C-G formula based on SCr of 1.17 mg/dL (H)). Liver Function Tests: Recent Labs  Lab 04/05/20 2206  AST 20  ALT 13  ALKPHOS 73  BILITOT 0.6  PROT 6.5  ALBUMIN 2.6*   No results for input(s): LIPASE, AMYLASE in the last 168 hours. Recent Labs  Lab 04/06/20 0026  AMMONIA <9*   Coagulation Profile: No results for input(s): INR, PROTIME in the last 168 hours. Cardiac Enzymes: No results for input(s): CKTOTAL, CKMB, CKMBINDEX, TROPONINI in the last 168 hours. BNP (last 3 results) No results for input(s): PROBNP in the last 8760 hours. HbA1C: No results for input(s): HGBA1C in the  last 72 hours. CBG: Recent Labs  Lab 04/05/20 2207  GLUCAP 109*   Lipid Profile: No results for input(s): CHOL, HDL, LDLCALC, TRIG, CHOLHDL, LDLDIRECT in the last 72 hours. Thyroid Function Tests: No results for input(s): TSH, T4TOTAL, FREET4, T3FREE, THYROIDAB in the last 72 hours. Anemia Panel: No results for input(s): VITAMINB12, FOLATE, FERRITIN, TIBC, IRON, RETICCTPCT in the last 72 hours. Urine analysis:    Component Value Date/Time   COLORURINE AMBER (A) 04/06/2020 0100   APPEARANCEUR TURBID (A) 04/06/2020 0100   LABSPEC 1.012 04/06/2020 0100   PHURINE 6.0 04/06/2020 0100   GLUCOSEU NEGATIVE 04/06/2020 0100   HGBUR MODERATE (A) 04/06/2020 0100   BILIRUBINUR NEGATIVE 04/06/2020 0100   KETONESUR NEGATIVE 04/06/2020 0100   PROTEINUR 100 (A) 04/06/2020  0100   NITRITE NEGATIVE 04/06/2020 0100   LEUKOCYTESUR LARGE (A) 04/06/2020 0100    Radiological Exams on Admission: DG Chest Port 1 View  Result Date: 04/06/2020 CLINICAL DATA:  Hallucinations EXAM: PORTABLE CHEST 1 VIEW COMPARISON:  October 14, 2018 FINDINGS: The heart size and mediastinal contours are within normal limits. Both lungs are clear. The visualized skeletal structures are unremarkable. Aortic calcifications are again noted. IMPRESSION: No active disease. Electronically Signed   By: Constance Holster M.D.   On: 04/06/2020 01:14    EKG: Independently reviewed.  Assessment/Plan Principal Problem:   Acute lower UTI Active Problems:   Diabetes ([place])   Essential hypertension   Anemia   Delirium   Erosion of bladder suspension mesh ([facility])   Acute metabolic encephalopathy    1. UTI - 1. 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 Empiric rocephin (most recent culture was pansensitive E.Coli last month at [facility]) 3. Culture pending 2. Acute metabolic encephalopathy - Delirium due to UTI 1. H/o same in past, most recently just last month 2. IVF: LR at 77 3. Treatment of UTI as  above 4. PRN low dose haldol so RNs can try and get her to keep IV in (shes pulled 2 out so far and counting). 3. Anemia - 1. Chronic, suspect chronic blood loss from the eroding mesh, though doesn't have gross hematuria this time around un-like last month.  Also possibly anemia of CKD 2. Getting iron studies to see if anything we can treat 3. Daily CBC to monitor 4. Get type and screen 4. CKD 3- 1. Creat 1.17 today is baseline 5. DM - 1. Sensitive SSI AC - may need to convert to Q4H if she doesn't eat breakfast. 2. Hold metformin 6. HTN - 1. Med rec pending, not sure she will take PO meds at the moment anyhow  DVT prophylaxis: SCDs Code Status: DNR - yellow form at bedside, as is most form, was DNR last month during [facility] admission Family Communication: No family in room Disposition Plan: Home after mental status improved Consults called: None Admission status: Place in obs    Keali Mccraw, [place] [facility]  How to contact the [facility] Attending or Consulting provider [place] or covering provider during after hours [place], for this patient?  1. Check the care team in [facility] and look for a) attending/consulting TR[facility] provider listed and b) the [facility] team listed 2. Log into [email or web address]  Amion Physician Scheduling and messaging for groups and whole hospitals  On call and physician scheduling software for group practices, residents, hospitalists and other medical providers for call, clinic, rotation and shift schedules. OnCall Enterprise is a hospital-wide system for scheduling doctors and paging doctors on call. EasyPlot is for scientific plotting and data analysis.  [email or web address]  and use [place]'s universal password to access. If you do not have the password, please contact the hospital operator.  3. Locate the [facility] provider you are looking for under [facility] and page to a number that you can be directly reached. 4. If you still have difficulty reaching the provider,  please page the [facility] (Director on Call) for the Hospitalists listed on amion for assistance.  04/06/2020, 5:00 AM

## 2020-04-06 NOTE — ED Notes (Signed)
Pt asleep in bed. NADN. Will continue to monitor.

## 2020-04-06 NOTE — ED Notes (Addendum)
xray at bedside.  

## 2020-04-06 NOTE — Progress Notes (Signed)
Unable to complete admission d/t pt increased lethargy, AMS.

## 2020-04-07 ENCOUNTER — Encounter (HOSPITAL_COMMUNITY): Payer: Self-pay | Admitting: Internal Medicine

## 2020-04-07 LAB — CBC
HCT: 26.1 % — ABNORMAL LOW (ref 36.0–46.0)
Hemoglobin: 7.8 g/dL — ABNORMAL LOW (ref 12.0–15.0)
MCH: 24.7 pg — ABNORMAL LOW (ref 26.0–34.0)
MCHC: 29.9 g/dL — ABNORMAL LOW (ref 30.0–36.0)
MCV: 82.6 fL (ref 80.0–100.0)
Platelets: 474 10*3/uL — ABNORMAL HIGH (ref 150–400)
RBC: 3.16 MIL/uL — ABNORMAL LOW (ref 3.87–5.11)
RDW: 13.6 % (ref 11.5–15.5)
WBC: 13.7 10*3/uL — ABNORMAL HIGH (ref 4.0–10.5)
nRBC: 0 % (ref 0.0–0.2)

## 2020-04-07 LAB — BASIC METABOLIC PANEL
Anion gap: 11 (ref 5–15)
BUN: 14 mg/dL (ref 8–23)
CO2: 25 mmol/L (ref 22–32)
Calcium: 11.3 mg/dL — ABNORMAL HIGH (ref 8.9–10.3)
Chloride: 99 mmol/L (ref 98–111)
Creatinine, Ser: 0.97 mg/dL (ref 0.44–1.00)
GFR, Estimated: 55 mL/min — ABNORMAL LOW (ref >60)
Glucose, Bld: 85 mg/dL (ref 70–99)
Potassium: 4.3 mmol/L (ref 3.5–5.1)
Sodium: 135 mmol/L (ref 135–145)

## 2020-04-07 LAB — GLUCOSE, CAPILLARY
Glucose-Capillary: 72 mg/dL (ref 70–99)
Glucose-Capillary: 79 mg/dL (ref 70–99)
Glucose-Capillary: 81 mg/dL (ref 70–99)
Glucose-Capillary: 88 mg/dL (ref 70–99)
Glucose-Capillary: 103 mg/dL — ABNORMAL HIGH (ref 70–99)

## 2020-04-07 MED ORDER — SODIUM CHLORIDE 0.9 % IV SOLN
510.0000 mg | INTRAVENOUS | Status: DC
  Administered 2020-04-07: 510 mg via INTRAVENOUS
  Filled 2020-04-07: qty 17

## 2020-04-07 NOTE — Progress Notes (Signed)
PROGRESS NOTE    Christina Roberts  ZHG:[id number] DOB: 1926-11-07 DOA: 04/05/2020 PCP: Christina Bishop, DO  Brief Narrative: Mr. Christina Roberts is a 86 year old female history of diabetes mellitus, hypertension, history of bladder suspension with mesh which has eroded into the bladder, recurrent UTIs, recently hospitalized at [facility] with encephalopathy, UTI and hematuria, treated with antibiotics required a Foley catheter at discharge, seen by urogynecology at follow-up on 12/1, decision made not to pursue surgery due to advanced age, she was put on a course of Keflex. Presented to the ED with generalized weakness, hallucinations and confusion which started 5 days prior.   Assessment & Plan:   UTI -Recurrent UTIs related to erosion of bladder mesh -Continue IV ceftriaxone, cut down IV fluids -Follow-up urine cultures -Seen by urogynecology on 12/1, decision made not to pursue surgery due to advanced age  Delirium, metabolic encephalopathy -Due to UTI and dehydration, improving -Haldol as needed -PT/OT eval  Type 2 diabetes mellitus -CBG stable, sliding scale insulin  Severe iron deficiency anemia -Likely related to intermittent ongoing hematuria -Give IV iron   DVT prophylaxis: add lovenox Code Status: DNR Family Communication: updated daughter 12/14 Disposition Plan:  Status is: Inpatient  Remains inpatient appropriate because:Inpatient level of care appropriate due to severity of illness   Dispo: The patient is from: Home              Anticipated d/c is to: Home              Anticipated d/c date is: 2 days              Patient currently is not medically stable to d/c.  Consultants:      Procedures:   Antimicrobials:    Subjective: -No events overnight, confusion slowly improving  Objective: Vitals:   04/07/20 0000 04/07/20 0509 04/07/20 0837 04/07/20 1236  BP: (!) 142/55 (!) 163/69 (!) 158/61 (!) 143/68  Pulse: 78 83 84 81  Resp: 15  18 18   Temp: 98 F  (36.7 C) (!) 97.3 F (36.3 C) 99.1 F (37.3 C) 98.8 F (37.1 C)  TempSrc: Oral Oral Oral Oral  SpO2: 98% 97% 96% 98%  Weight:      Height:        Intake/Output Summary (Last 24 hours) at 04/07/2020 1412 Last data filed at 04/07/2020 0900 Gross per 24 hour  Intake 2006.02 ml  Output --  Net 2006.02 ml   Filed Weights   04/05/20 2147  Weight: 59 kg    Examination:  General exam: Chronically ill elderly female, laying in bed, awake alert oriented to self and partly to place only, cognitive deficits noted CVS: S1-S2, regular rhythm  Lungs: Decreased breath sounds both bases, otherwise clear Abdomen: Soft, obese, nontender, bowel sounds present Extremities: No edema Skin: No rashes on exposed skin  Data Reviewed:   CBC: Recent Labs  Lab 04/05/20 2206 04/06/20 0515 04/07/20 0048  WBC 14.8* 14.1* 13.7*  NEUTROABS 10.6*  --   --   HGB 8.1* 8.4* 7.8*  HCT 27.2* 27.0* 26.1*  MCV 84.0 82.8 82.6  PLT 506* 485* 196*   Basic Metabolic Panel: Recent Labs  Lab 04/05/20 2206 04/06/20 0515 04/07/20 0048  NA 133* 135 135  K 4.3 4.5 4.3  CL 97* 99 99  CO2 25 26 25   GLUCOSE 111* 100* 85  BUN 19 17 14   CREATININE 1.17* 1.19* 0.97  CALCIUM 11.5* 11.7* 11.3*   GFR: Estimated Creatinine Clearance: 35.2 mL/min (by C-G formula  based on SCr of 0.97 mg/dL). Liver Function Tests: Recent Labs  Lab 04/05/20 2206  AST 20  ALT 13  ALKPHOS 73  BILITOT 0.6  PROT 6.5  ALBUMIN 2.6*   No results for input(s): LIPASE, AMYLASE in the last 168 hours. Recent Labs  Lab 04/06/20 0026  AMMONIA <9*   Coagulation Profile: No results for input(s): INR, PROTIME in the last 168 hours. Cardiac Enzymes: No results for input(s): CKTOTAL, CKMB, CKMBINDEX, TROPONINI in the last 168 hours. BNP (last 3 results) No results for input(s): PROBNP in the last 8760 hours. HbA1C: Recent Labs    04/06/20 0515  HGBA1C 5.5   CBG: Recent Labs  Lab 04/06/20 2039 04/06/20 2312  04/07/20 0510 04/07/20 0833 04/07/20 1119  GLUCAP 78 80 72 79 81   Lipid Profile: No results for input(s): CHOL, HDL, LDLCALC, TRIG, CHOLHDL, LDLDIRECT in the last 72 hours. Thyroid Function Tests: Recent Labs    04/06/20 0515  TSH 4.449   Anemia Panel: Recent Labs    04/06/20 0515  VITAMINB12 359  FOLATE 68.6  FERRITIN 56  TIBC 241*  IRON 20*  RETICCTPCT 1.8   Urine analysis:    Component Value Date/Time   COLORURINE AMBER (A) 04/06/2020 0100   APPEARANCEUR TURBID (A) 04/06/2020 0100   LABSPEC 1.012 04/06/2020 0100   PHURINE 6.0 04/06/2020 0100   GLUCOSEU NEGATIVE 04/06/2020 0100   HGBUR MODERATE (A) 04/06/2020 0100   BILIRUBINUR NEGATIVE 04/06/2020 0100   KETONESUR NEGATIVE 04/06/2020 0100   PROTEINUR 100 (A) 04/06/2020 0100   NITRITE NEGATIVE 04/06/2020 0100   LEUKOCYTESUR LARGE (A) 04/06/2020 0100   Sepsis Labs: @LABRCNTIP (procalcitonin:4,lacticidven:4)  ) Recent Results (from the past 240 hour(s))  Urine culture     Status: None (Preliminary result)   Collection Time: 04/06/20  1:00 AM   Specimen: Urine, Random  Result Value Ref Range Status   Specimen Description URINE, RANDOM  Final   Special Requests NONE  Final   Culture   Final    CULTURE REINCUBATED FOR BETTER GROWTH Performed at [facility], [place] [address], [place], [place] [address]    Report Status PENDING  Incomplete  Resp Panel by RT-PCR (Flu A&B, Covid) Nasopharyngeal Swab     Status: None   Collection Time: 04/06/20  1:03 AM   Specimen: Nasopharyngeal Swab; Nasopharyngeal(NP) swabs in vial transport medium  Result Value Ref Range Status   SARS Coronavirus 2 by RT PCR NEGATIVE NEGATIVE Final    Comment: (NOTE) SARS-CoV-2 target nucleic acids are NOT DETECTED.  The SARS-CoV-2 RNA is generally detectable in upper respiratory specimens during the acute phase of infection. The lowest concentration of SARS-CoV-2 viral copies this assay can detect is 138 copies/mL. A negative result  does not preclude SARS-Cov-2 infection and should not be used as the sole basis for treatment or other patient management decisions. A negative result may occur with  improper specimen collection/handling, submission of specimen other than nasopharyngeal swab, presence of viral mutation(s) within the areas targeted by this assay, and inadequate number of viral copies(<138 copies/mL). A negative result must be combined with clinical observations, patient history, and epidemiological information. The expected result is Negative.  Fact Sheet for Patients:  EntrepreneurPulse.com.au  Fact Sheet for Healthcare Providers:  IncredibleEmployment.be  This test is no t yet approved or cleared by the [place] FDA and  has been authorized for detection and/or diagnosis of SARS-CoV-2 by FDA under an Emergency Use Authorization (EUA). This EUA will remain  in  effect (meaning this test can be used) for the duration of the COVID-19 declaration under Section 564(b)(1) of the Act, 21 U.S.C.section 360bbb-3(b)(1), unless the authorization is terminated  or revoked sooner.       Influenza A by PCR NEGATIVE NEGATIVE Final   Influenza B by PCR NEGATIVE NEGATIVE Final    Comment: (NOTE) The Xpert Xpress SARS-CoV-2/FLU/RSV plus assay is intended as an aid in the diagnosis of influenza from Nasopharyngeal swab specimens and should not be used as a sole basis for treatment. Nasal washings and aspirates are unacceptable for Xpert Xpress SARS-CoV-2/FLU/RSV testing.  Fact Sheet for Patients: EntrepreneurPulse.com.au  Fact Sheet for Healthcare Providers: IncredibleEmployment.be  This test is not yet approved or cleared by the [place] FDA and has been authorized for detection and/or diagnosis of SARS-CoV-2 by FDA under an Emergency Use Authorization (EUA). This EUA will remain in effect (meaning this test can be used) for  the duration of the COVID-19 declaration under Section 564(b)(1) of the Act, 21 U.S.C. section 360bbb-3(b)(1), unless the authorization is terminated or revoked.  Performed at [facility], [place] [address], [place], [place] [address]     Radiology Studies: DG Chest Port 1 View  Result Date: 04/06/2020 CLINICAL DATA:  Hallucinations EXAM: PORTABLE CHEST 1 VIEW COMPARISON:  October 14, 2018 FINDINGS: The heart size and mediastinal contours are within normal limits. Both lungs are clear. The visualized skeletal structures are unremarkable. Aortic calcifications are again noted. IMPRESSION: No active disease. Electronically Signed   By: Constance Holster M.D.   On: 04/06/2020 01:14    Scheduled Meds: . aspirin  81 mg Oral Daily  . atorvastatin  20 mg Oral Daily  . insulin aspart  0-9 Units Subcutaneous TID WC  . metoprolol succinate  12.5 mg Oral Daily  . PARoxetine  20 mg Oral Daily   Continuous Infusions: . cefTRIAXone (ROCEPHIN)  IV 1 g (04/07/20 0438)  . lactated ringers 75 mL/hr at 04/06/20 0640     LOS: 1 day    Time spent: 76min  Domenic Polite, MD [facility]  04/07/2020, 2:12 PM

## 2020-04-08 LAB — URINE CULTURE: Culture: 10000 — AB

## 2020-04-08 LAB — BASIC METABOLIC PANEL
Anion gap: 12 (ref 5–15)
BUN: 15 mg/dL (ref 8–23)
CO2: 26 mmol/L (ref 22–32)
Calcium: 11.4 mg/dL — ABNORMAL HIGH (ref 8.9–10.3)
Chloride: 98 mmol/L (ref 98–111)
Creatinine, Ser: 1.1 mg/dL — ABNORMAL HIGH (ref 0.44–1.00)
GFR, Estimated: 47 mL/min — ABNORMAL LOW (ref >60)
Glucose, Bld: 92 mg/dL (ref 70–99)
Potassium: 3.9 mmol/L (ref 3.5–5.1)
Sodium: 136 mmol/L (ref 135–145)

## 2020-04-08 LAB — GLUCOSE, CAPILLARY
Glucose-Capillary: 95 mg/dL (ref 70–99)
Glucose-Capillary: 119 mg/dL — ABNORMAL HIGH (ref 70–99)
Glucose-Capillary: 90 mg/dL (ref 70–99)
Glucose-Capillary: 80 mg/dL (ref 70–99)

## 2020-04-08 LAB — CBC
HCT: 28.3 % — ABNORMAL LOW (ref 36.0–46.0)
Hemoglobin: 8.5 g/dL — ABNORMAL LOW (ref 12.0–15.0)
MCH: 24.9 pg — ABNORMAL LOW (ref 26.0–34.0)
MCHC: 30 g/dL (ref 30.0–36.0)
MCV: 82.7 fL (ref 80.0–100.0)
Platelets: 523 10*3/uL — ABNORMAL HIGH (ref 150–400)
RBC: 3.42 MIL/uL — ABNORMAL LOW (ref 3.87–5.11)
RDW: 13.7 % (ref 11.5–15.5)
WBC: 19.1 10*3/uL — ABNORMAL HIGH (ref 4.0–10.5)
nRBC: 0 % (ref 0.0–0.2)

## 2020-04-08 MED ORDER — SODIUM CHLORIDE 0.9 % IV SOLN
1.0000 g | Freq: Three times a day (TID) | INTRAVENOUS | Status: DC
  Administered 2020-04-08 – 2020-04-13 (×16): 1 g via INTRAVENOUS
  Filled 2020-04-08 (×19): qty 1000

## 2020-04-08 NOTE — Progress Notes (Signed)
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 Presented to the ED with generalized weakness, hallucinations and confusion which started 5 days prior.   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 -Seen by urogynecology on 12/1, decision made not to pursue surgery due to advanced age, she has another follow-up on 12/29 -Ambulate, PT OT eval, out of bed to chair -Encouraged p.o. intake  Delirium, metabolic encephalopathy -Due to UTI and dehydration -Improving slowly, much more clearer today -PT/OT eval  Type 2 diabetes mellitus -CBG stable, sliding scale insulin  Severe iron deficiency anemia -Likely related to intermittent ongoing hematuria from bladder mesh issue -Given IV iron -Add oral iron at discharge   DVT prophylaxis:  lovenox Code Status: DNR Family Communication: updated daughter 12/12 and 12/14 Disposition Plan:  Status is: Inpatient  Remains inpatient appropriate because:Inpatient level of care appropriate due to severity of illness   Dispo: The patient is from: Home              Anticipated d/c is to: Home versus  SNF for rehab              Anticipated d/c date is: 2 days              Patient currently is not medically stable to d/c.  Consultants:   Procedures:   Antimicrobials:    Subjective: -No events overnight, confusion slowly improving, poor p.o. intake yesterday per staff  Objective: Vitals:   04/07/20 1637 04/07/20 2036 04/08/20 0406 04/08/20 1030  BP: 138/71 (!) 156/57 (!) 147/68 (!) 132/54  Pulse: 84 87 94 94  Resp: 16 17 17 18   Temp: 98.6 F (37 C) 98.3 F (36.8 C) 97.9 F (36.6 C) 99.1 F (37.3 C)  TempSrc: Oral   Oral  SpO2: 99% 98% 93% 97%  Weight:      Height:        Intake/Output Summary (Last 24 hours) at 04/08/2020 1346 Last data filed at 04/08/2020 0600 Gross per 24 hour  Intake 217 ml  Output --  Net 217 ml   Filed Weights   04/05/20 2147  Weight: 59 kg    Examination:  General exam: Chronically ill elderly female, laying in bed, awake alert oriented to self and partly to time only, cognitive deficits noted CVS: S1-S2, regular rate rhythm Lungs: Decreased breath sounds at both bases, otherwise clear Abdomen: Soft, nontender, bowel sounds present Extremities: No edema Neuro: Moves all extremities, no localizing signs Skin: No rashes on exposed skin  Data Reviewed:   CBC: Recent Labs  Lab 04/05/20 2206 04/06/20 0515 04/07/20 0048 04/08/20 0347  WBC 14.8* 14.1* 13.7* 19.1*  NEUTROABS 10.6*  --   --   --   HGB  8.1* 8.4* 7.8* 8.5*  HCT 27.2* 27.0* 26.1* 28.3*  MCV 84.0 82.8 82.6 82.7  PLT 506* 485* 474* 983*   Basic Metabolic Panel: Recent Labs  Lab 04/05/20 2206 04/06/20 0515 04/07/20 0048 04/08/20 0347  NA 133* 135 135 136  K 4.3 4.5 4.3 3.9  CL 97* 99 99 98  CO2 25 26 25 26   GLUCOSE 111* 100* 85 92  BUN 19 17 14 15   CREATININE 1.17* 1.19* 0.97 1.10*  CALCIUM 11.5* 11.7* 11.3* 11.4*   GFR: Estimated Creatinine Clearance: 31 mL/min (A) (by C-G formula based on SCr of 1.1 mg/dL (H)). Liver Function Tests: Recent Labs  Lab  04/05/20 2206  AST 20  ALT 13  ALKPHOS 73  BILITOT 0.6  PROT 6.5  ALBUMIN 2.6*   No results for input(s): LIPASE, AMYLASE in the last 168 hours. Recent Labs  Lab 04/06/20 0026  AMMONIA <9*   Coagulation Profile: No results for input(s): INR, PROTIME in the last 168 hours. Cardiac Enzymes: No results for input(s): CKTOTAL, CKMB, CKMBINDEX, TROPONINI in the last 168 hours. BNP (last 3 results) No results for input(s): PROBNP in the last 8760 hours. HbA1C: Recent Labs    04/06/20 0515  HGBA1C 5.5   CBG: Recent Labs  Lab 04/07/20 1119 04/07/20 1700 04/07/20 2038 04/08/20 0816 04/08/20 1209  GLUCAP 81 88 103* 95 119*   Lipid Profile: No results for input(s): CHOL, HDL, LDLCALC, TRIG, CHOLHDL, LDLDIRECT in the last 72 hours. Thyroid Function Tests: Recent Labs    04/06/20 0515  TSH 4.449   Anemia Panel: Recent Labs    04/06/20 0515  VITAMINB12 359  FOLATE 68.6  FERRITIN 56  TIBC 241*  IRON 20*  RETICCTPCT 1.8   Urine analysis:    Component Value Date/Time   COLORURINE AMBER (A) 04/06/2020 0100   APPEARANCEUR TURBID (A) 04/06/2020 0100   LABSPEC 1.012 04/06/2020 0100   PHURINE 6.0 04/06/2020 0100   GLUCOSEU NEGATIVE 04/06/2020 0100   HGBUR MODERATE (A) 04/06/2020 0100   BILIRUBINUR NEGATIVE 04/06/2020 0100   KETONESUR NEGATIVE 04/06/2020 0100   PROTEINUR 100 (A) 04/06/2020 0100   NITRITE NEGATIVE 04/06/2020 0100   LEUKOCYTESUR LARGE (A) 04/06/2020 0100   Sepsis Labs: @LABRCNTIP (procalcitonin:4,lacticidven:4)  ) Recent Results (from the past 240 hour(s))  Urine culture     Status: Abnormal   Collection Time: 04/06/20  1:00 AM   Specimen: Urine, Random  Result Value Ref Range Status   Specimen Description URINE, RANDOM  Final   Special Requests   Final    NONE Performed at [facility], [place] [address], [place], [place] [address]    Culture 10,000 COLONIES/mL ENTEROCOCCUS FAECALIS (A)  Final   Report Status 04/08/2020 FINAL  Final    Organism ID, Bacteria ENTEROCOCCUS FAECALIS (A)  Final      Susceptibility   Enterococcus faecalis - MIC*    AMPICILLIN <=2 SENSITIVE Sensitive     NITROFURANTOIN <=16 SENSITIVE Sensitive     VANCOMYCIN 1 SENSITIVE Sensitive     * 10,000 COLONIES/mL ENTEROCOCCUS FAECALIS  Resp Panel by RT-PCR (Flu A&B, Covid) Nasopharyngeal Swab     Status: None   Collection Time: 04/06/20  1:03 AM   Specimen: Nasopharyngeal Swab; Nasopharyngeal(NP) swabs in vial transport medium  Result Value Ref Range Status   SARS Coronavirus 2 by RT PCR NEGATIVE NEGATIVE Final    Comment: (NOTE) SARS-CoV-2 target nucleic acids are NOT DETECTED.  The SARS-CoV-2 RNA is generally detectable in upper respiratory specimens  during the acute phase of infection. The lowest concentration of SARS-CoV-2 viral copies this assay can detect is 138 copies/mL. A negative result does not preclude SARS-Cov-2 infection and should not be used as the sole basis for treatment or other patient management decisions. A negative result may occur with  improper specimen collection/handling, submission of specimen other than nasopharyngeal swab, presence of viral mutation(s) within the areas targeted by this assay, and inadequate number of viral copies(<138 copies/mL). A negative result must be combined with clinical observations, patient history, and epidemiological information. The expected result is Negative.  Fact Sheet for Patients:  EntrepreneurPulse.com.au  Fact Sheet for Healthcare Providers:  IncredibleEmployment.be  This test is no t yet approved or cleared by the [place] FDA and  has been authorized for detection and/or diagnosis of SARS-CoV-2 by FDA under an Emergency Use Authorization (EUA). This EUA will remain  in effect (meaning this test can be used) for the duration of the COVID-19 declaration under Section 564(b)(1) of the Act, 21 U.S.C.section 360bbb-3(b)(1), unless the  authorization is terminated  or revoked sooner.       Influenza A by PCR NEGATIVE NEGATIVE Final   Influenza B by PCR NEGATIVE NEGATIVE Final    Comment: (NOTE) The Xpert Xpress SARS-CoV-2/FLU/RSV plus assay is intended as an aid in the diagnosis of influenza from Nasopharyngeal swab specimens and should not be used as a sole basis for treatment. Nasal washings and aspirates are unacceptable for Xpert Xpress SARS-CoV-2/FLU/RSV testing.  Fact Sheet for Patients: EntrepreneurPulse.com.au  Fact Sheet for Healthcare Providers: IncredibleEmployment.be  This test is not yet approved or cleared by the [place] FDA and has been authorized for detection and/or diagnosis of SARS-CoV-2 by FDA under an Emergency Use Authorization (EUA). This EUA will remain in effect (meaning this test can be used) for the duration of the COVID-19 declaration under Section 564(b)(1) of the Act, 21 U.S.C. section 360bbb-3(b)(1), unless the authorization is terminated or revoked.  Performed at [facility], [place] [address], [place], [place] [address]     Radiology Studies: No results found.  Scheduled Meds: . aspirin  81 mg Oral Daily  . atorvastatin  20 mg Oral Daily  . insulin aspart  0-9 Units Subcutaneous TID WC  . metoprolol succinate  12.5 mg Oral Daily  . PARoxetine  20 mg Oral Daily   Continuous Infusions: . ampicillin (OMNIPEN) IV 1 g (04/08/20 1320)  . ferumoxytol 510 mg (04/07/20 1644)     LOS: 2 days    Time spent: 49min  Domenic Polite, MD [facility]  04/08/2020, 1:46 PM

## 2020-04-09 LAB — BASIC METABOLIC PANEL
Anion gap: 12 (ref 5–15)
BUN: 11 mg/dL (ref 8–23)
CO2: 25 mmol/L (ref 22–32)
Calcium: 11.6 mg/dL — ABNORMAL HIGH (ref 8.9–10.3)
Chloride: 101 mmol/L (ref 98–111)
Creatinine, Ser: 1.05 mg/dL — ABNORMAL HIGH (ref 0.44–1.00)
GFR, Estimated: 50 mL/min — ABNORMAL LOW (ref >60)
Glucose, Bld: 83 mg/dL (ref 70–99)
Potassium: 3.6 mmol/L (ref 3.5–5.1)
Sodium: 138 mmol/L (ref 135–145)

## 2020-04-09 LAB — GLUCOSE, CAPILLARY
Glucose-Capillary: 75 mg/dL (ref 70–99)
Glucose-Capillary: 122 mg/dL — ABNORMAL HIGH (ref 70–99)
Glucose-Capillary: 93 mg/dL (ref 70–99)

## 2020-04-09 LAB — ALBUMIN: Albumin: 2.3 g/dL — ABNORMAL LOW (ref 3.5–5.0)

## 2020-04-09 LAB — CBC
HCT: 29.7 % — ABNORMAL LOW (ref 36.0–46.0)
Hemoglobin: 9.1 g/dL — ABNORMAL LOW (ref 12.0–15.0)
MCH: 25.3 pg — ABNORMAL LOW (ref 26.0–34.0)
MCHC: 30.6 g/dL (ref 30.0–36.0)
MCV: 82.7 fL (ref 80.0–100.0)
Platelets: 476 10*3/uL — ABNORMAL HIGH (ref 150–400)
RBC: 3.59 MIL/uL — ABNORMAL LOW (ref 3.87–5.11)
RDW: 13.9 % (ref 11.5–15.5)
WBC: 18.9 10*3/uL — ABNORMAL HIGH (ref 4.0–10.5)
nRBC: 0 % (ref 0.0–0.2)

## 2020-04-09 MED ORDER — ACETAMINOPHEN 650 MG RE SUPP: 650.0000 mg | Freq: Four times a day (QID) | RECTAL | Status: DC | PRN

## 2020-04-09 MED ORDER — HYDROCODONE-ACETAMINOPHEN 7.5-325 MG PO TABS
0.5000 | ORAL_TABLET | Freq: Two times a day (BID) | ORAL | Status: DC | PRN
  Administered 2020-04-10: 0.5 via ORAL
  Filled 2020-04-09: qty 1

## 2020-04-09 MED ORDER — SODIUM CHLORIDE 0.9 % IV SOLN: INTRAVENOUS | Status: AC

## 2020-04-09 MED ORDER — ACETAMINOPHEN 325 MG PO TABS: 650.0000 mg | ORAL_TABLET | Freq: Four times a day (QID) | ORAL | Status: DC | PRN

## 2020-04-09 NOTE — Evaluation (Signed)
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  Presented to [facility] ED with generalized weakness, hallucinations and confusion which started 5 days prior.  Clinical Impression  PTA, pt lives with daughter and receives assistance for ADLs and transfers. Patient has caregiver that assists with daily tasks (10 AM-4PM). Patient with increasing weakness lately requiring increased physical assistance at home. Per daughter, patient is typically oriented but confusion increases with recurrent UTIs. Patient initially awake with appropriate responses, however became fatigued and lethargic at end of session. Patient requires maxA+2 for supine to sit and sit to stand transfer, unable to take steps at bedside this session. Patient totalA+2 for sit to supine due to fatigue and lethargy. Patient presents with impaired cognition, generalized weakness, impaired balance, decreased activity tolerance, and impaired functional mobility. Patient will benefit from skilled PT services to address listed deficits. Recommend SNF following discharge to maximize functional mobility, decrease fall risk, and decrease caregiver burden.     Follow Up Recommendations SNF;Supervision/Assistance - 24 hour    Equipment Recommendations   (defer to post acute rehab)    Recommendations for Other Services       Precautions / Restrictions Precautions Precautions: Fall Restrictions Weight Bearing Restrictions: No      Mobility  Bed Mobility Overal bed mobility: Needs Assistance Bed Mobility: Supine to Sit;Sit to Supine     Supine to sit: Max assist;+2 for physical assistance;+2 for  safety/equipment;HOB elevated Sit to supine: Total assist;+2 for physical assistance;+2 for safety/equipment   General bed mobility comments: Pt assisting in scooting hips and pulling therapist's hands to EOB, but heavy assist for trunk mgmt and trunk/LEs on return to supine    Transfers Overall transfer level: Needs assistance Equipment used: Rolling Abir Craine (2 wheeled) Transfers: Sit to/from Stand Sit to Stand: Max assist;+2 physical assistance;+2 safety/equipment         General transfer comment: Max A x 2 for sit to stand, posterior bias and difficulty gaining balance. Unable to take steps due to increasing fatigue/need for assist for balance  Ambulation/Gait                Stairs            Wheelchair Mobility    Modified Rankin (Stroke Patients Only)       Balance Overall balance assessment: Needs assistance Sitting-balance support: Bilateral upper extremity supported;Feet supported Sitting balance-Leahy Scale: Poor Sitting balance - Comments: reliant on UE support EOB, LOB to R/front at times, requiring Min A to correct   Standing balance support: Bilateral upper extremity supported;During functional activity Standing balance-Leahy Scale: Poor Standing balance comment: reliant on UE support and external support x 2                             Pertinent Vitals/Pain Pain Assessment: Faces Faces Pain Scale: Hurts little more Pain Location: back Pain Descriptors / Indicators: Moaning;Grimacing Pain Intervention(s): Monitored during session;Repositioned    Home Living Family/patient expects to be discharged to:: Private residence Living Arrangements: Children Available Help at Discharge: Family;Personal care attendant Type of Home: House Home Access: Stairs to enter   Technical brewer of Steps: 4 Home Layout: One level Home Equipment:  Hospital bed;Dainelle Hun - 2 wheels;Grab bars - toilet;Tub bench Additional Comments: Home setup  obtained from daughter via phone due to pt poor historian. They have caregiver from 10Am-4PM to assist with bathing, dressing and transfers.    Prior Function Level of Independence: Needs assistance   Gait / Transfers Assistance Needed: Has not walking in > 1 month. Assistance for transfers provided by daughter and caregiver (out of bed, to toilet, and up/down steps).  ADL's / Homemaking Assistance Needed: Able to self feed, but messy at times when infections begin per daughter. Assistance for dressing tasks, sponge bathing seated on toilet (has only used shower/bench once).  Comments: PLOF obtained from daughter via phone due to pt poor historian     Hand Dominance   Dominant Hand: Right    Extremity/Trunk Assessment   Upper Extremity Assessment Upper Extremity Assessment: Defer to OT evaluation LUE Deficits / Details: L shouler flexion to about 100*    Lower Extremity Assessment Lower Extremity Assessment: Generalized weakness    Cervical / Trunk Assessment Cervical / Trunk Assessment: Kyphotic  Communication   Communication: Other (comment) (soft spoken)  Cognition Arousal/Alertness: Awake/alert;Lethargic (awake then lethargic by end of session) Behavior During Therapy: WFL for tasks assessed/performed;Flat affect Overall Cognitive Status: History of cognitive impairments - at baseline Area of Impairment: Orientation;Attention;Memory;Following commands;Safety/judgement;Awareness;Problem solving                 Orientation Level: Situation;Time Current Attention Level: Sustained Memory: Decreased short-term memory Following Commands: Follows one step commands with increased time Safety/Judgement: Decreased awareness of deficits;Decreased awareness of safety Awareness: Emergent Problem Solving: Slow processing;Decreased initiation;Difficulty sequencing;Requires verbal cues;Requires tactile cues General Comments: Pt A&Ox2, initially awake and conversive with  appropriate responses, but lethargic by end of session (fatigued?). When MD asked who pt lives with, she reports mother/father and children. Per daughter, pt typically oriented but gets more confused with infections      General Comments General comments (skin integrity, edema, etc.): Per OT, spoke with pt's daughter on phone for further information about PLOF. Open to rehab vs home with physical assist (plans to work on 24/7 assist as appropriate)    Exercises     Assessment/Plan    PT Assessment Patient needs continued PT services  PT Problem List Decreased strength;Decreased activity tolerance;Decreased balance;Decreased mobility;Decreased cognition;Decreased safety awareness       PT Treatment Interventions DME instruction;Gait training;Functional mobility training;Therapeutic activities;Therapeutic exercise;Balance training;Patient/family education    PT Goals (Current goals can be found in the Care Plan section)  Acute Rehab PT Goals Patient Stated Goal: daughter would like for pt to increase ability to transfer, regain strength as appropriate PT Goal Formulation: Patient unable to participate in goal setting Time For Goal Achievement: 04/23/20 Potential to Achieve Goals: Fair    Frequency Min 2X/week   Barriers to discharge        Co-evaluation PT/OT/SLP Co-Evaluation/Treatment: Yes Reason for Co-Treatment: Necessary to address cognition/behavior during functional activity;For patient/therapist safety;To address functional/ADL transfers PT goals addressed during session: Mobility/safety with mobility;Proper use of DME OT goals addressed during session: ADL's and self-care       AM-PAC PT "6 Clicks" Mobility  Outcome Measure Help needed turning from your back to your side while in a flat bed without using bedrails?: A Lot Help needed moving from lying on your back to sitting on the side of a flat bed without using bedrails?: A Lot Help needed moving to and from a bed  to a chair (including a wheelchair)?:  A Lot Help needed standing up from a chair using your arms (e.g., wheelchair or bedside chair)?: A Lot Help needed to walk in hospital room?: Total Help needed climbing 3-5 steps with a railing? : Total 6 Click Score: 10    End of Session Equipment Utilized During Treatment: Gait belt Activity Tolerance: Patient limited by lethargy Patient left: in bed;with call bell/phone within reach;with bed alarm set Nurse Communication: Mobility status PT Visit Diagnosis: Unsteadiness on feet (R26.81);Muscle weakness (generalized) (M62.81);Difficulty in walking, not elsewhere classified (R26.2)    Time: 0802-0820 PT Time Calculation (min) (ACUTE ONLY): 18 min   Charges:   PT Evaluation $PT Eval Moderate Complexity: 1 Mod          Perrin Maltese, PT, DPT Acute Rehabilitation Services Pager [phone] Office [phone]   Christina Roberts 04/09/2020, 10:00 AM

## 2020-04-09 NOTE — Progress Notes (Signed)
[facility])  Christina Roberts is our community based palliative care patient.  [facility] will follow while hospitalized and resume services once he is discharged.  Thank you, Venia Carbon RN, BSN, [facility] Liaison

## 2020-04-09 NOTE — Progress Notes (Addendum)
PROGRESS NOTE   Kevona Lupinacci  KZL:[id number]    DOB: Dec 23, 1926    DOA: 04/05/2020  PCP: Lyman Bishop, DO   I have briefly reviewed patients previous medical records in [facility].  Chief Complaint  Patient presents with  . 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  Admitted for recurrent acute lower UTI/Enterococcus UTI, complicated UTI with acute infectious encephalopathy.  Improving.   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  As per daughter, has been having recurrent UTIs for the last 10 years.  Urine culture showed Enterococcus, only 10K colonies, however she was partially treated-was on 10-day course of Keflex prior to admission.  Empirically started IV ceftriaxone was changed to ampicillin to complete a 5-day course.  Afebrile, leukocytosis slightly better but still up at 18 K, family to update regarding mental status-currently somnolent from pain medicines.  Seen by urogynecology on 03/26/2020 and decision made not to pursue surgery due to advanced age.  She has an  upcoming follow-up on 04/23/2020.  Therapies have evaluated and recommend SNF.  Daughter considering same.  She also indicates that patient has palliative care follow-up at home.  Hypercalcemia  Unclear etiology.  Could also be contributing to her mental status changes.  Request repeat serum albumin today.  Corrected serum calcium between 12.3-12.7 (for albumin of 2.6)  IV normal saline hydration and follow CMP in a.m.  Check intact PTH and further work-up based on results.  Acute encephalopathy, infectious etiology  Due to UTI and dehydration.  As per daughter, no formal diagnosis of dementia but may have some degree of age-related cognitive impairment or dementia now complicated by acute infectious etiology.  As per yesterday's note, mental status improving.  Currently sedated after pain medications, daughter to advise when able as to her current mental status.  Delirium precautions and minimize opioids/sedatives as much as possible.  Type II DM, controlled  A1c 5.5 on 04/06/2020 indicating excellent control.  Discontinue SSI, continue CBG monitoring.  Holding Metformin.  Essential hypertension  Controlled on Toprol-XL, continue  Hyperlipidemia:  Continue statins.  Severe iron deficiency anemia:  May be related to chronic hematuria related to erosion of bladder suspension mesh into bladder wall.  Anemia panel 12/12: Iron 20, TIBC 241, saturation ratio 8, ferritin 56, folate 68.6, B12: 359.  S/p dose of IV iron on 12/14, next dose supposed to be for today.  Hemoglobin stable.  Periodic follow-up of CBC as outpatient.   Body mass index is 20.98 kg/m.    DVT prophylaxis: SCDs Start: 04/06/20 0457     Code Status: DNR Family Communication: Discussed in detail with patient's daughter via phone on  04/09/2020, updated care and answered questions Disposition:  Status is: Inpatient  Remains inpatient appropriate because:Inpatient level of care appropriate  due to severity of illness   Dispo: The patient is from: Home              Anticipated d/c is to: SNF              Anticipated d/c date is: 2 days              Patient currently is not medically stable to d/c.        Consultants:   None  Procedures:   None  Antimicrobials:    Anti-infectives (From admission, onward)   Start     Dose/Rate Route Frequency Ordered Stop   04/08/20 1400  ampicillin (OMNIPEN) 1 g in sodium chloride 0.9 % 100 mL IVPB        1 g 300 mL/hr over 20 Minutes Intravenous Every 8 hours 04/08/20 1233     04/07/20 0415  cefTRIAXone (ROCEPHIN) 1 g in sodium chloride 0.9 % 100 mL IVPB  Status:  Discontinued        1 g 200 mL/hr over 30 Minutes Intravenous Every 24 hours 04/06/20 0431 04/08/20 1233   04/06/20 0415  cefTRIAXone (ROCEPHIN) 1 g in sodium chloride 0.9 % 100 mL IVPB        1 g 200 mL/hr over 30 Minutes Intravenous  Once 04/06/20 0405 04/06/20 0639        Subjective:  Patient seen this morning.  Oriented to self and place.  Denies complaints.  No pain reported.  Slightly confused-states that she lives with her 6 siblings and her parents, not the case per discussion with her daughter.  Objective:   Vitals:   04/08/20 1457 04/08/20 1948 04/09/20 0440 04/09/20 0851  BP: (!) 142/67 (!) 148/79 (!) 159/68 (!) 127/51  Pulse: 93 87 92 (!) 101  Resp: 18 15 16 17   Temp: 98.9 F (37.2 C) 98.7 F (37.1 C) 98.7 F (37.1 C) 98.9 F (37.2 C)  TempSrc: Oral   Oral  SpO2: 96% 96% 95% 93%  Weight:      Height:        General exam: Pleasant elderly female, moderately built and thinly nourished lying comfortably propped up in bed without distress.  OT getting ready to work with her.  Oral mucosa with borderline hydration. Respiratory system: Clear to auscultation. Respiratory effort normal. Cardiovascular system: S1 & S2 heard, RRR. No JVD, murmurs, rubs, gallops or clicks. No pedal edema. Gastrointestinal system: Abdomen is nondistended, soft and  nontender. No organomegaly or masses felt. Normal bowel sounds heard. GU: Has pure wick.  Currently without urine in canister. Central nervous system: Alert and oriented to self and place. No focal neurological deficits.  Follows simple instructions.  Speaks in low tone voice. Extremities: Symmetric 5 x 5 power. Skin: No rashes, lesions or ulcers Psychiatry: Judgement and insight impaired. Mood & affect appropriate.     Data Reviewed:   I have personally reviewed following labs and imaging studies   CBC: Recent Labs  Lab 04/05/20 2206 04/06/20 0515 04/07/20 0048 04/08/20 0347 04/09/20 0357  WBC 14.8*   < > 13.7* 19.1* 18.9*  NEUTROABS 10.6*  --   --   --   --   HGB 8.1*   < > 7.8* 8.5* 9.1*  HCT 27.2*   < > 26.1* 28.3* 29.7*  MCV 84.0   < > 82.6 82.7 82.7  PLT  506*   < > 474* 523* 476*   < > = values in this interval not displayed.    Basic Metabolic Panel: Recent Labs  Lab 04/07/20 0048 04/08/20 0347 04/09/20 0357  NA 135 136 138  K 4.3 3.9 3.6  CL 99 98 101  CO2 25 26 25   GLUCOSE 85 92 83  BUN 14 15 11   CREATININE 0.97 1.10* 1.05*  CALCIUM 11.3* 11.4* 11.6*    Liver Function Tests: Recent Labs  Lab 04/05/20 2206  AST 20  ALT 13  ALKPHOS 73  BILITOT 0.6  PROT 6.5  ALBUMIN 2.6*    CBG: Recent Labs  Lab 04/08/20 1645 04/08/20 1950 04/09/20 0649  GLUCAP 90 80 75    Microbiology Studies:   Recent Results (from the past 240 hour(s))  Urine culture     Status: Abnormal   Collection Time: 04/06/20  1:00 AM   Specimen: Urine, Random  Result Value Ref Range Status   Specimen Description URINE, RANDOM  Final   Special Requests   Final    NONE Performed at [facility], [place] [address] [place], [place] [address]    Culture 10,000 COLONIES/mL ENTEROCOCCUS FAECALIS (A)  Final   Report Status 04/08/2020 FINAL  Final   Organism ID, Bacteria ENTEROCOCCUS FAECALIS (A)  Final      Susceptibility   Enterococcus faecalis - MIC*    AMPICILLIN <=2  SENSITIVE Sensitive     NITROFURANTOIN <=16 SENSITIVE Sensitive     VANCOMYCIN 1 SENSITIVE Sensitive     * 10,000 COLONIES/mL ENTEROCOCCUS FAECALIS  Resp Panel by RT-PCR (Flu A&B, Covid) Nasopharyngeal Swab     Status: None   Collection Time: 04/06/20  1:03 AM   Specimen: Nasopharyngeal Swab; Nasopharyngeal(NP) swabs in vial transport medium  Result Value Ref Range Status   SARS Coronavirus 2 by RT PCR NEGATIVE NEGATIVE Final    Comment: (NOTE) SARS-CoV-2 target nucleic acids are NOT DETECTED.  The SARS-CoV-2 RNA is generally detectable in upper respiratory specimens during the acute phase of infection. The lowest concentration of SARS-CoV-2 viral copies this assay can detect is 138 copies/mL. A negative result does not preclude SARS-Cov-2 infection and should not be used as the sole basis for treatment or other patient management decisions. A negative result may occur with  improper specimen collection/handling, submission of specimen other than nasopharyngeal swab, presence of viral mutation(s) within the areas targeted by this assay, and inadequate number of viral copies(<138 copies/mL). A negative result must be combined with clinical observations, patient history, and epidemiological information. The expected result is Negative.  Fact Sheet for Patients:  EntrepreneurPulse.com.au  Fact Sheet for Healthcare Providers:  IncredibleEmployment.be  This test is no t yet approved or cleared by the [place] FDA and  has been authorized for detection and/or diagnosis of SARS-CoV-2 by FDA under an Emergency Use Authorization (EUA). This EUA will remain  in effect (meaning this test can be used) for the duration of the COVID-19 declaration under Section 564(b)(1) of the Act, 21 U.S.C.section 360bbb-3(b)(1), unless the authorization is terminated  or revoked sooner.       Influenza A by PCR NEGATIVE NEGATIVE Final   Influenza B by PCR  NEGATIVE NEGATIVE Final    Comment: (NOTE) The Xpert Xpress SARS-CoV-2/FLU/RSV plus assay is intended as an aid in the diagnosis of influenza from Nasopharyngeal swab specimens and should not be used as a sole basis for treatment. Nasal washings and aspirates are unacceptable for Xpert Xpress  SARS-CoV-2/FLU/RSV testing.  Fact Sheet for Patients: EntrepreneurPulse.com.au  Fact Sheet for Healthcare Providers: IncredibleEmployment.be  This test is not yet approved or cleared by the [place] FDA and has been authorized for detection and/or diagnosis of SARS-CoV-2 by FDA under an Emergency Use Authorization (EUA). This EUA will remain in effect (meaning this test can be used) for the duration of the COVID-19 declaration under Section 564(b)(1) of the Act, 21 U.S.C. section 360bbb-3(b)(1), unless the authorization is terminated or revoked.  Performed at [facility], [place] [address], [place], [place] [address]      Radiology Studies:  No results found.   Scheduled Meds:   . aspirin  81 mg Oral Daily  . atorvastatin  20 mg Oral Daily  . insulin aspart  0-9 Units Subcutaneous TID WC  . metoprolol succinate  12.5 mg Oral Daily  . PARoxetine  20 mg Oral Daily    Continuous Infusions:   . ampicillin (OMNIPEN) IV 1 g (04/09/20 0500)  . ferumoxytol 510 mg (04/07/20 1644)     LOS: 3 days     Vernell Leep, MD, [place], [facility]. [facility]    To contact the attending provider between 7A-7P or the covering provider during after hours 7P-7A, please log into the web site [email or web address] and access using universal [place] password for that web site. If you do not have the password, please call the hospital operator.  04/09/2020, 11:29 AM

## 2020-04-09 NOTE — Evaluation (Signed)
 Occupational Therapy Evaluation Patient Details Name: Christina Roberts MRN: [id number] DOB: Dec 27, 1926 Today's Date: 04/09/2020    History of Present Illness Pt is a 86 year old female history of diabetes mellitus, hypertension, history of bladder suspension with mesh which has eroded into the bladder, recurrent UTIs, recently hospitalized at [facility] with encephalopathy, UTI and hematuria.  Presented to [facility] ED with generalized weakness, hallucinations and confusion which started 5 days prior.   Clinical Impression   PTA, pt lives with daughter and receives assistance with ADLs and transfers. Pt has caregiver that assists with daily tasks (10Am-4PM). Pt with increasing weakness lately requiring increased physical assistance at home. Per daughter, pt typically oriented but confusion increases with recurrent UTIs. Pt presents now with deficits in cognition, endurance, balance, and endurance. Pt initially awake with appropriate responses but became fatigued and lethargic by end of session. Pt requires extensive +2 assistance for bed mobility and sit to stand attempt with RW. Pt unable to take steps at bedside today. Pt requires Max A for UB ADLs and Total A for LB ADLs at this time. Pt able to demo ability to self feed at Setup/Supervision level. Recommend SNF for short term rehab to maximize functional abilities, decrease fall risk and lessen caregiver burden. If daughter decides to take pt home, recommend HHOT, 24/7 physical assist.     Follow Up Recommendations  SNF;Supervision/Assistance - 24 hour    Equipment Recommendations  3 in 1 bedside commode;Wheelchair (measurements OT);Wheelchair cushion (measurements OT)    Recommendations for Other Services       Precautions / Restrictions Precautions Precautions: Fall Restrictions Weight Bearing Restrictions: No      Mobility Bed Mobility Overal bed mobility: Needs Assistance Bed Mobility: Supine to Sit;Sit to Supine     Supine to sit:  Max assist;+2 for physical assistance;+2 for safety/equipment;HOB elevated Sit to supine: Total assist;+2 for physical assistance;+2 for safety/equipment   General bed mobility comments: Pt assisting in scooting hips and pulling therapist's hands to EOB, but heavy assist for trunk mgmt and trunk/LEs on return to supine    Transfers Overall transfer level: Needs assistance Equipment used: Rolling walker (2 wheeled) Transfers: Sit to/from Stand Sit to Stand: Max assist;+2 physical assistance;+2 safety/equipment         General transfer comment: Max A x 2 for sit to stand, posterior bias and difficulty gaining balance. Unable to take steps due to increasing fatigue/need for assist for balance    Balance Overall balance assessment: Needs assistance Sitting-balance support: Bilateral upper extremity supported;Feet supported Sitting balance-Leahy Scale: Poor Sitting balance - Comments: reliant on UE support EOB, LOB to R/front at times, requiring Min A to correct   Standing balance support: Bilateral upper extremity supported;During functional activity Standing balance-Leahy Scale: Poor Standing balance comment: reliant on UE support and external support x 2                           ADL either performed or assessed with clinical judgement   ADL Overall ADL's : Needs assistance/impaired Eating/Feeding: Supervision/ safety;Sitting Eating/Feeding Details (indicate cue type and reason): Supervision with cues for problem solving/setup. Pt messy, unaware that hand in oatmeal when reaching for other food items, etc Grooming: Supervision/safety;Bed level;Wash/dry face;Wash/dry Nurse, mental health Details (indicate cue type and reason): Supervision for washing face/hands after meal, thorough Upper Body Bathing: Moderate assistance;Bed level   Lower Body Bathing: Total assistance;Bed level   Upper Body Dressing : Maximal assistance;Bed level   Lower Body  Dressing: Total assistance;Bed  level Lower Body Dressing Details (indicate cue type and reason): Total A to don socks     Toileting- Clothing Manipulation and Hygiene: Total assistance;Bed level Toileting - Clothing Manipulation Details (indicate cue type and reason): Use of purewick, urinating after stand attempt       General ADL Comments: Pt limited by cognition, fatigue and weakness     Vision Patient Visual Report: No change from baseline Vision Assessment?: No apparent visual deficits     Perception     Praxis      Pertinent Vitals/Pain Pain Assessment: Faces Faces Pain Scale: Hurts little more Pain Location: back Pain Descriptors / Indicators: Moaning;Grimacing Pain Intervention(s): Monitored during session;Repositioned     Hand Dominance Right   Extremity/Trunk Assessment Upper Extremity Assessment Upper Extremity Assessment: Generalized weakness;LUE deficits/detail LUE Deficits / Details: L shouler flexion to about 100*   Lower Extremity Assessment Lower Extremity Assessment: Defer to PT evaluation   Cervical / Trunk Assessment Cervical / Trunk Assessment: Kyphotic   Communication Communication Communication: Other (comment) (soft spoken)   Cognition Arousal/Alertness: Awake/alert;Lethargic (awake then lethargic by end of session) Behavior During Therapy: WFL for tasks assessed/performed;Flat affect Overall Cognitive Status: History of cognitive impairments - at baseline Area of Impairment: Orientation;Attention;Memory;Following commands;Safety/judgement;Awareness;Problem solving                 Orientation Level: Situation;Time Current Attention Level: Sustained Memory: Decreased short-term memory Following Commands: Follows one step commands with increased time Safety/Judgement: Decreased awareness of deficits;Decreased awareness of safety Awareness: Emergent Problem Solving: Slow processing;Decreased initiation;Difficulty sequencing;Requires verbal cues;Requires tactile  cues General Comments: Pt A&Ox2, initially awake and conversive with appropriate responses, but lethargic by end of session (fatigued?). When MD asked who pt lives with, she reports mother/father and children. Per daughter, pt typically oriented but gets more confused with infections   General Comments  Spoke with daughter via phone for further information on pt background. Increasing assist and difficulty with transfers/ADLs - understands complexity of medical issues. Open to rehab vs home with physical assist (plans to work on 24/7 assist as appropriate)    Exercises     Shoulder Instructions      Home Living Family/patient expects to be discharged to:: Private residence Living Arrangements: Children Available Help at Discharge: Family;Personal care attendant Type of Home: House Home Access: Stairs to enter Technical  of Steps: [place]: One level     Bathroom Shower/Tub: Tub/shower unit         [facility] bed;Walker - 2 wheels;Grab bars - toilet;Tub bench   Additional Comments: Home setup obtained from daughter via phone due to pt poor historian. They have caregiver from 10Am-4PM to assist with bathing, dressing and transfers.      Prior Functioning/Environment Level of Independence: Needs assistance  Gait / Transfers Assistance Needed: Has not walking in > 1 month. Assistance for transfers provided by daughter and caregiver (out of bed, to toilet, and up/down steps). ADL's / Homemaking Assistance Needed: Able to self feed, but messy at times when infections begin per daughter. Assistance for dressing tasks, sponge bathing seated on toilet (has only used shower/bench once). Communication / Swallowing Assistance Needed: Pt typically oriented and appropriate with conversations. Comments: PLOF obtained from daughter via phone due to pt poor historian        OT Problem List: Decreased strength;Decreased activity tolerance;Impaired balance  (sitting and/or standing);Decreased range of motion;Decreased safety awareness;Decreased cognition;Decreased knowledge of use of DME or AE  OT Treatment/Interventions: Self-care/ADL training;Therapeutic exercise;Energy conservation;DME and/or AE instruction;Therapeutic activities;Patient/family education;Balance training    OT Goals(Current goals can be found in the care plan section) Acute Rehab OT Goals Patient Stated Goal: daughter would like for pt to increase ability to transfer, regain strength as appropriate OT Goal Formulation: With patient/family Time For Goal Achievement: 04/09/20 Potential to Achieve Goals: Fair ADL Goals Pt Will Perform Eating: with modified independence;sitting Pt Will Perform Grooming: with modified independence;sitting Pt Will Transfer to Toilet: with min assist;stand pivot transfer;bedside commode Pt Will Perform Toileting - Clothing Manipulation and hygiene: with min guard assist;sit to/from stand Additional ADL Goal #1: Pt to demonstrate ability to sit EOB for 3-5 min during ADLs with no more than Supervision to increase participation/endurance  OT Frequency: Min 2X/week   Barriers to D/C:            Co-evaluation PT/OT/SLP Co-Evaluation/Treatment: Yes Reason for Co-Treatment: Necessary to address cognition/behavior during functional activity;For patient/therapist safety;To address functional/ADL transfers   OT goals addressed during session: ADL's and self-care      AM-PAC OT "6 Clicks" Daily Activity     Outcome Measure Help from another person eating meals?: A Little Help from another person taking care of personal grooming?: A Little Help from another person toileting, which includes using toliet, bedpan, or urinal?: Total Help from another person bathing (including washing, rinsing, drying)?: Total Help from another person to put on and taking off regular upper body clothing?: A Lot Help from another person to put on and taking off  regular lower body clothing?: Total 6 Click Score: 11   End of Session Equipment Utilized During Treatment: Gait belt;Rolling walker Nurse Communication: Mobility status  Activity Tolerance: Patient limited by lethargy;Patient limited by fatigue Patient left: in bed;with call bell/phone within reach;with bed alarm set  OT Visit Diagnosis: Unsteadiness on feet (R26.81);Other abnormalities of gait and mobility (R26.89);Muscle weakness (generalized) (M62.81);Other symptoms and signs involving cognitive function                Time: 0742-0820 OT Time Calculation (min): 38 min Charges:  OT General Charges $OT Visit: 1 Visit OT Evaluation $OT Eval Moderate Complexity: 1 Mod OT Treatments $Self Care/Home Management : 8-22 mins  Layla Maw, OTR/L  Layla Maw 04/09/2020, 8:42 AM

## 2020-04-10 LAB — VITAMIN D 25 HYDROXY (VIT D DEFICIENCY, FRACTURES): Vit D, 25-Hydroxy: 88.07 ng/mL (ref 30–100)

## 2020-04-10 LAB — COMPREHENSIVE METABOLIC PANEL
ALT: 13 U/L (ref 0–44)
AST: 19 U/L (ref 15–41)
Albumin: 2.3 g/dL — ABNORMAL LOW (ref 3.5–5.0)
Alkaline Phosphatase: 72 U/L (ref 38–126)
Anion gap: 12 (ref 5–15)
BUN: 11 mg/dL (ref 8–23)
CO2: 23 mmol/L (ref 22–32)
Calcium: 11.3 mg/dL — ABNORMAL HIGH (ref 8.9–10.3)
Chloride: 107 mmol/L (ref 98–111)
Creatinine, Ser: 0.96 mg/dL (ref 0.44–1.00)
GFR, Estimated: 56 mL/min — ABNORMAL LOW (ref >60)
Glucose, Bld: 89 mg/dL (ref 70–99)
Potassium: 3.3 mmol/L — ABNORMAL LOW (ref 3.5–5.1)
Sodium: 142 mmol/L (ref 135–145)
Total Bilirubin: 0.7 mg/dL (ref 0.3–1.2)
Total Protein: 5.7 g/dL — ABNORMAL LOW (ref 6.5–8.1)

## 2020-04-10 LAB — CBC
HCT: 27 % — ABNORMAL LOW (ref 36.0–46.0)
Hemoglobin: 8.5 g/dL — ABNORMAL LOW (ref 12.0–15.0)
MCH: 26.2 pg (ref 26.0–34.0)
MCHC: 31.5 g/dL (ref 30.0–36.0)
MCV: 83.1 fL (ref 80.0–100.0)
Platelets: 397 10*3/uL (ref 150–400)
RBC: 3.25 MIL/uL — ABNORMAL LOW (ref 3.87–5.11)
RDW: 14.1 % (ref 11.5–15.5)
WBC: 17.5 10*3/uL — ABNORMAL HIGH (ref 4.0–10.5)
nRBC: 0 % (ref 0.0–0.2)

## 2020-04-10 LAB — GLUCOSE, CAPILLARY
Glucose-Capillary: 110 mg/dL — ABNORMAL HIGH (ref 70–99)
Glucose-Capillary: 76 mg/dL (ref 70–99)
Glucose-Capillary: 84 mg/dL (ref 70–99)
Glucose-Capillary: 90 mg/dL (ref 70–99)
Glucose-Capillary: 97 mg/dL (ref 70–99)

## 2020-04-10 LAB — PTH, INTACT AND CALCIUM
Calcium, Total (PTH): 11.3 mg/dL — ABNORMAL HIGH (ref 8.7–10.3)
PTH: 5 pg/mL — ABNORMAL LOW (ref 15–65)

## 2020-04-10 MED ORDER — SODIUM CHLORIDE 0.9 % IV SOLN: INTRAVENOUS | Status: AC

## 2020-04-10 MED ORDER — CALCITONIN (SALMON) 200 UNIT/ACT NA SOLN
1.0000 | Freq: Every day | NASAL | Status: DC
  Administered 2020-04-10 – 2020-04-13 (×4): 1 via NASAL
  Filled 2020-04-10: qty 3.7

## 2020-04-10 MED ORDER — POTASSIUM CHLORIDE CRYS ER 20 MEQ PO TBCR
40.0000 meq | EXTENDED_RELEASE_TABLET | Freq: Once | ORAL | Status: AC
  Administered 2020-04-10: 40 meq via ORAL
  Filled 2020-04-10: qty 2

## 2020-04-10 NOTE — Care Management Important Message (Signed)
Important Message  Patient Details  Name: Christina Roberts MRN: [id number] Date of Birth: [birth year]   Medicare Important Message Given:  Yes     Trista Ciocca P [place] 04/10/2020, 11:02 AM

## 2020-04-10 NOTE — Plan of Care (Signed)
  Problem: Education: °Goal: Knowledge of General Education information will improve °Description: Including pain rating scale, medication(s)/side effects and non-pharmacologic comfort measures °Outcome: Progressing °  °Problem: Health Behavior/Discharge Planning: °Goal: Ability to manage health-related needs will improve °Outcome: Progressing °  °Problem: Clinical Measurements: °Goal: Ability to maintain clinical measurements within normal limits will improve °Outcome: Progressing °Goal: Will remain free from infection °Outcome: Progressing °Goal: Diagnostic test results will improve °Outcome: Progressing °Goal: Respiratory complications will improve °Outcome: Progressing °Goal: Cardiovascular complication will be avoided °Outcome: Progressing °  °Problem: Activity: °Goal: Risk for activity intolerance will decrease °Outcome: Progressing °  °Problem: Nutrition: °Goal: Adequate nutrition will be maintained °Outcome: Progressing °  °Problem: Coping: °Goal: Level of anxiety will decrease °Outcome: Progressing °  °Problem: Safety: °Goal: Ability to remain free from injury will improve °Outcome: Progressing °  °Problem: Pain Managment: °Goal: General experience of comfort will improve °Outcome: Progressing °  °

## 2020-04-10 NOTE — Progress Notes (Signed)
PROGRESS NOTE   Christina Roberts  OIZ:[id number]    DOB: November 12, 1926    DOA: 04/05/2020  PCP: Lyman Bishop, DO   I have briefly reviewed patients previous medical records in [facility].  Chief Complaint  Patient presents with  . 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  Admitted for recurrent acute lower UTI/Enterococcus UTI, complicated UTI with acute infectious encephalopathy.  Persistent hypercalcemia, unclear etiology, ongoing work-up.   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  As per daughter, has been having recurrent UTIs for the last 10 years.  Urine culture showed Enterococcus, only 10K colonies, however she was partially treated-was on 10-day course of Keflex prior to admission.  Empirically started IV ceftriaxone which was changed to ampicillin to complete a 5-day course.  May need to treat longer due to foreign body/mesh in the bladder wall.?  Need for suppression antibiotics at discharge.  Seen by urogynecology on 03/26/2020 and decision made not to pursue surgery due  to advanced age.  She has an upcoming follow-up on 04/23/2020.  Therapies have evaluated and recommend SNF.  Daughter considering same.   As per chart review, patient followed by a [facility]-community-based palliative care.  Hypercalcemia  Unclear etiology.  Could also be contributing to her mental status changes.  Corrected serum calcium between 12.3-12.7 (for albumin of 2.3)  IV normal saline hydration and follow CMP in a.m.  PTH intact: 5 (low).  Vitamin D, 25-hydroxy: 88.07 (normal)  Vitamin D, 1, 25, hydroxy and PTH RP pending.  Trial of nasal calcitonin.  May need Zometa.  Hypokalemia:  Replace and follow.  Acute encephalopathy, infectious etiology  Due to UTI and dehydration.  As per daughter, no formal diagnosis of dementia but may have some degree of age-related cognitive impairment or dementia now complicated by acute infectious etiology.  Delirium precautions and minimize opioids/sedatives as much as possible.  Appears to be alert and mostly oriented to simple questions this morning and follow simple instructions.  Await daughter to update [place] regarding her current mental status related to baseline.  Type II DM, controlled  A1c 5.5 on 04/06/2020 indicating excellent control.  Discontinued SSI, continue CBG monitoring.  Holding Metformin.  Essential hypertension  Controlled on Toprol-XL, continue.  Mildly uncontrolled at times  Hyperlipidemia:  Continue statins.  Severe iron deficiency anemia:  May be related to chronic hematuria related to erosion of bladder suspension mesh into bladder wall.  Anemia panel 12/12: Iron 20, TIBC 241, saturation ratio 8, ferritin 56, folate 68.6, B12: 359.  S/p dose of IV iron on 12/14, next dose supposed to be for today.  Hemoglobin stable.  Periodic follow-up of  CBC as outpatient.   Body mass index is 20.98 kg/m.    DVT prophylaxis: SCDs Start: 04/06/20 0457     Code Status: DNR Family Communication:  Discussed in detail with patient's daughter via phone on 04/09/2020, updated care and answered questions.  None at bedside today. Disposition:  Status is: Inpatient  Remains inpatient appropriate because:Inpatient level of care appropriate due to severity of illness   Dispo: The patient is from: Home              Anticipated d/c is to: SNF              Anticipated d/c date is: 2 days              Patient currently is not medically stable to d/c.        Consultants:   None  Procedures:   None  Antimicrobials:    Anti-infectives (From admission, onward)   Start     Dose/Rate Route Frequency Ordered Stop   04/08/20 1400  ampicillin (OMNIPEN) 1 g in sodium chloride 0.9 % 100 mL IVPB        1 g 300 mL/hr over 20 Minutes Intravenous Every 8 hours 04/08/20 1233     04/07/20 0415  cefTRIAXone (ROCEPHIN) 1 g in sodium chloride 0.9 % 100 mL IVPB  Status:  Discontinued        1 g 200 mL/hr over 30 Minutes Intravenous Every 24 hours 04/06/20 0431 04/08/20 1233   04/06/20 0415  cefTRIAXone (ROCEPHIN) 1 g in sodium chloride 0.9 % 100 mL IVPB        1 g 200 mL/hr over 30 Minutes Intravenous  Once 04/06/20 0405 04/06/20 1696        Subjective:  Patient seen and examined along with her female RN in room.  Looks better compared to yesterday.  Alert and oriented to self, place, partly to time-does not know the exact date, month or year but able to tell us that the new year is coming soon.  She reports feeling "miserable" because "this is the year and".  As per RN, patient reportedly was having some visual hallucinations this morning as per night nurse sign off.  None at this time.  Objective:   Vitals:   04/10/20 0506 04/10/20 0829 04/10/20 1503 04/10/20 1503  BP: (!) 167/71 (!) 174/72 (!) 142/73 (!) 142/73  Pulse: 84 87 93 92  Resp: 18 16 18 18   Temp: 98.3 F (36.8 C) 98.7 F (37.1 C) (!) 97.4 F (36.3 C) (!) 97.4 F (36.3 C)  TempSrc: Oral Oral Oral Oral  SpO2: 96% 94% 97% 97%   Weight:      Height:        General exam: Pleasant elderly female, moderately built and thinly nourished lying comfortably propped up in bed without distress.  Oral mucosa with borderline hydration.   Respiratory system: Clear to auscultation.  No increased work of breathing. Cardiovascular system: S1 & S2 heard, RRR. No JVD, murmurs, rubs, gallops or clicks. No pedal edema. Gastrointestinal system: Abdomen is nondistended, soft and nontender. No organomegaly or masses felt. Normal bowel sounds heard. Central nervous system: Mental status as noted above.  No focal neurological deficits. Follows simple instructions.  Speaks in low tone voice. Extremities: Symmetric 5 x 5 power. Skin: No rashes, lesions or ulcers Psychiatry: Judgement and insight impaired. Mood & affect appropriate.     Data Reviewed:   I have personally reviewed following labs and imaging studies  CBC: Recent Labs  Lab 04/05/20 2206 04/06/20 0515 04/08/20 0347 04/09/20 0357 04/10/20 0412  WBC 14.8*   < > 19.1* 18.9* 17.5*  NEUTROABS 10.6*  --   --   --   --   HGB 8.1*   < > 8.5* 9.1* 8.5*  HCT 27.2*   < > 28.3* 29.7* 27.0*  MCV 84.0   < > 82.7 82.7 83.1  PLT 506*   < > 523* 476* 397   < > = values in this interval not displayed.    Basic Metabolic Panel: Recent Labs  Lab 04/08/20 0347 04/09/20 0357 04/09/20 1239 04/10/20 0412  NA 136 138  --  142  K 3.9 3.6  --  3.3*  CL 98 101  --  107  CO2 26 25  --  23  GLUCOSE 92 83  --  89  BUN 15 11  --  11  CREATININE 1.10* 1.05*  --  0.96  CALCIUM 11.4* 11.6* 11.3* 11.3*    Liver Function Tests: Recent Labs  Lab 04/05/20 2206 04/09/20 1239 04/10/20 0412  AST 20  --  19  ALT 13  --  13  ALKPHOS 73  --  72  BILITOT 0.6  --  0.7  PROT 6.5  --  5.7*  ALBUMIN 2.6* 2.3* 2.3*    CBG: Recent Labs  Lab 04/10/20 0054 04/10/20 0643 04/10/20 1114  GLUCAP 84 76 90    Microbiology Studies:   Recent Results (from the past 240 hour(s))  Urine  culture     Status: Abnormal   Collection Time: 04/06/20  1:00 AM   Specimen: Urine, Random  Result Value Ref Range Status   Specimen Description URINE, RANDOM  Final   Special Requests   Final    NONE Performed at [facility], [place] [address] [place], [place] [address]    Culture 10,000 COLONIES/mL ENTEROCOCCUS FAECALIS (A)  Final   Report Status 04/08/2020 FINAL  Final   Organism ID, Bacteria ENTEROCOCCUS FAECALIS (A)  Final      Susceptibility   Enterococcus faecalis - MIC*    AMPICILLIN <=2 SENSITIVE Sensitive     NITROFURANTOIN <=16 SENSITIVE Sensitive     VANCOMYCIN 1 SENSITIVE Sensitive     * 10,000 COLONIES/mL ENTEROCOCCUS FAECALIS  Resp Panel by RT-PCR (Flu A&B, Covid) Nasopharyngeal Swab     Status: None   Collection Time: 04/06/20  1:03 AM   Specimen: Nasopharyngeal Swab; Nasopharyngeal(NP) swabs in vial transport medium  Result Value Ref Range Status   SARS Coronavirus 2 by RT PCR NEGATIVE NEGATIVE Final    Comment: (NOTE) SARS-CoV-2 target nucleic acids are NOT DETECTED.  The SARS-CoV-2 RNA is generally detectable in upper respiratory specimens during the acute phase of infection. The lowest concentration of SARS-CoV-2 viral copies this assay can detect is 138 copies/mL. A negative result does not preclude SARS-Cov-2 infection and should not be used as the sole basis for treatment or other patient management decisions. A negative result may occur with  improper specimen collection/handling, submission of specimen other than nasopharyngeal swab, presence of viral mutation(s) within the areas targeted by this assay, and inadequate number of viral copies(<138 copies/mL). A negative result must be combined with clinical observations, patient history, and epidemiological information. The expected result is Negative.  Fact Sheet for Patients:  EntrepreneurPulse.com.au  Fact Sheet for Healthcare Providers:   IncredibleEmployment.be  This test is no t yet approved or cleared by the [place] and  has been authorized for detection and/or diagnosis of SARS-CoV-2 by FDA under an Emergency Use Authorization (EUA). This EUA will remain  in effect (meaning this test can be used) for the duration of the COVID-19 declaration under Section 564(b)(1) of the Act, 21 U.S.C.section 360bbb-3(b)(1), unless the authorization is terminated  or revoked sooner.       Influenza A by PCR NEGATIVE NEGATIVE Final   Influenza B by PCR NEGATIVE NEGATIVE Final    Comment: (NOTE) The Xpert Xpress SARS-CoV-2/FLU/RSV plus assay is intended as an aid in the diagnosis of influenza from Nasopharyngeal swab specimens and should not be used as a sole basis for treatment. Nasal washings and aspirates are unacceptable for Xpert Xpress SARS-CoV-2/FLU/RSV testing.  Fact Sheet for Patients: EntrepreneurPulse.com.au  Fact Sheet for Healthcare Providers: IncredibleEmployment.be  This test is not yet approved or cleared by the [place] FDA and has been authorized for detection and/or diagnosis of SARS-CoV-2 by FDA under an Emergency Use Authorization (EUA). This EUA will remain in effect (meaning this test can be used) for the duration of the COVID-19 declaration under Section 564(b)(1) of the Act, 21 U.S.C. section 360bbb-3(b)(1), unless the authorization is terminated or revoked.  Performed at [facility], [place] [address] [place],  [address]      Radiology Studies:  No results found.   Scheduled Meds:   . aspirin  81 mg Oral Daily  . atorvastatin  20 mg Oral Daily  . metoprolol succinate  12.5 mg Oral Daily  . PARoxetine  20 mg Oral Daily    Continuous Infusions:   . ampicillin (OMNIPEN) IV 1 g (04/10/20 1315)  . ferumoxytol 510 mg (04/07/20 1644)     LOS: 4 days     Vernell Leep, MD, [place], [facility]     To contact the attending provider between 7A-7P or the covering provider during after hours 7P-7A, please log into the web site [email or web address] and access using universal [place] password for that web site. If you do not have the password, please call the hospital operator.  04/10/2020, 3:27 PM

## 2020-04-10 NOTE — TOC Initial Note (Signed)
Transition of Care [facility]) - Initial/Assessment Note    Patient Details  Name: Christina Roberts MRN: [id number] Date of Birth: Jan 28, 1927  Transition of Care [facility]) CM/SW Contact:    Sharin Mons, RN Phone Number: 04/10/2020, 3:59 PM  Clinical Narrative:         Admitted with UTT. 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  From home with daugter Christina Roberts.          RNCM received consult for possible SNF placement at time of discharge. RNCM spoke with patient's daughter Christina Roberts regarding PT recommendation of SNF placement at time of discharge. Christina Roberts reported that she is currently unable to care for patient at their home given patient's current physical needs and fall risk. Christina Roberts expressed understanding of PT recommendation and is agreeable to SNF placement at time of discharge. Christina Roberts reports preference for   [facility] . RNCM discussed insurance authorization process and provided Medicare SNF ratings list. Patient expressed being hopeful for rehab and to feel better soon. No further questions reported at this time. RNCM to continue to follow and assist with discharge planning needs.  Pt COVID vaccinated.  Expected Discharge Plan: Skilled Nursing Facility Barriers to Discharge: Continued Medical Work up   Patient Goals and CMS Choice   CMS Medicare.gov Compare Post Acute Care list provided to:: Patient Choice offered to / list presented to : Adult Children  Expected Discharge Plan and Services Expected Discharge Plan: [place]                                              Prior Living Arrangements/Services                       Activities of Daily Living   ADL Screening (condition at time of admission) Patient's cognitive ability adequate to safely complete daily activities?: No Does the patient have difficulty concentrating, remembering, or making  decisions?: Yes Patient able to express need for assistance with ADLs?: No Does the patient have difficulty dressing or bathing?: Yes Independently performs ADLs?: No Communication: Needs assistance Dressing (OT): Dependent Grooming: Dependent Feeding: Dependent Bathing: Dependent Toileting: Dependent In/Out Bed: Dependent Walks in Home: Dependent Is this a change from baseline?: Pre-admission baseline Does the patient have difficulty walking or climbing stairs?: Yes Weakness of Legs: Both Weakness of Arms/Hands: Both  Permission Sought/Granted                  Emotional Assessment              Admission diagnosis:  Delirium [R41.0] Acute cystitis without hematuria [N30.00] Fatigue, unspecified type [R53.83] Anemia, unspecified type [D64.9] AMS (altered mental status) [R41.82] Patient Active Problem List   Diagnosis Date Noted  . Anemia 04/06/2020  . Delirium 04/06/2020  . Acute lower UTI 04/06/2020  . Erosion of bladder suspension mesh ([place]) 04/06/2020  . Acute metabolic encephalopathy 10/17/7626  . Gastritis 10/16/2018  . Hiatal hernia   . Ventral hernia   . Diverticular disease   . Closed compression fracture of body of L1 vertebra ([place])   . UTI (urinary tract infection) 10/15/2018  . Diabetes ([place]) 10/15/2018  . Essential hypertension 10/15/2018  . Chest pain 10/14/2018   PCP:  Lyman Bishop, DO Pharmacy:   [facility]  [place], [place] [address] [place] [address] Phone: [phone] Fax: [place], [place] - [address] [place] [address] [place] [address] Phone: [phone] Fax: [phone]     Social Determinants of Health (SDOH) Interventions    Readmission Risk Interventions No flowsheet data found.

## 2020-04-11 ENCOUNTER — Other Ambulatory Visit: Payer: Medicare Other | Admitting: Internal Medicine

## 2020-04-11 ENCOUNTER — Inpatient Hospital Stay (HOSPITAL_COMMUNITY): Payer: Medicare Other

## 2020-04-11 DIAGNOSIS — Z66 Do not resuscitate: Secondary | ICD-10-CM

## 2020-04-11 DIAGNOSIS — Z7189 Other specified counseling: Secondary | ICD-10-CM

## 2020-04-11 DIAGNOSIS — Z515 Encounter for palliative care: Secondary | ICD-10-CM

## 2020-04-11 DIAGNOSIS — D72829 Elevated white blood cell count, unspecified: Secondary | ICD-10-CM

## 2020-04-11 LAB — COMPREHENSIVE METABOLIC PANEL
ALT: 14 U/L (ref 0–44)
AST: 18 U/L (ref 15–41)
Albumin: 2.3 g/dL — ABNORMAL LOW (ref 3.5–5.0)
Alkaline Phosphatase: 74 U/L (ref 38–126)
Anion gap: 8 (ref 5–15)
BUN: 10 mg/dL (ref 8–23)
CO2: 25 mmol/L (ref 22–32)
Calcium: 11.4 mg/dL — ABNORMAL HIGH (ref 8.9–10.3)
Chloride: 106 mmol/L (ref 98–111)
Creatinine, Ser: 0.89 mg/dL (ref 0.44–1.00)
GFR, Estimated: >60 mL/min (ref >60)
Glucose, Bld: 107 mg/dL — ABNORMAL HIGH (ref 70–99)
Potassium: 3.5 mmol/L (ref 3.5–5.1)
Sodium: 139 mmol/L (ref 135–145)
Total Bilirubin: 1 mg/dL (ref 0.3–1.2)
Total Protein: 5.8 g/dL — ABNORMAL LOW (ref 6.5–8.1)

## 2020-04-11 LAB — CBC
HCT: 24.9 % — ABNORMAL LOW (ref 36.0–46.0)
Hemoglobin: 7.9 g/dL — ABNORMAL LOW (ref 12.0–15.0)
MCH: 26.1 pg (ref 26.0–34.0)
MCHC: 31.7 g/dL (ref 30.0–36.0)
MCV: 82.2 fL (ref 80.0–100.0)
Platelets: 462 10*3/uL — ABNORMAL HIGH (ref 150–400)
RBC: 3.03 MIL/uL — ABNORMAL LOW (ref 3.87–5.11)
RDW: 14.2 % (ref 11.5–15.5)
WBC: 20.4 10*3/uL — ABNORMAL HIGH (ref 4.0–10.5)
nRBC: 0 % (ref 0.0–0.2)

## 2020-04-11 LAB — GLUCOSE, CAPILLARY
Glucose-Capillary: 81 mg/dL (ref 70–99)
Glucose-Capillary: 91 mg/dL (ref 70–99)
Glucose-Capillary: 78 mg/dL (ref 70–99)
Glucose-Capillary: 73 mg/dL (ref 70–99)

## 2020-04-11 LAB — CALCITRIOL (1,25 DI-OH VIT D): Vit D, 1,25-Dihydroxy: 38.4 pg/mL (ref 19.9–79.3)

## 2020-04-11 MED ORDER — SODIUM CHLORIDE 0.9 % IV SOLN: INTRAVENOUS | Status: AC

## 2020-04-11 MED ORDER — FUROSEMIDE 10 MG/ML IJ SOLN
20.0000 mg | Freq: Once | INTRAMUSCULAR | Status: AC
  Administered 2020-04-11: 20 mg via INTRAVENOUS
  Filled 2020-04-11: qty 2

## 2020-04-11 MED ORDER — IOHEXOL 300 MG/ML  SOLN
100.0000 mL | Freq: Once | INTRAMUSCULAR | Status: AC | PRN
  Administered 2020-04-11: 100 mL via INTRAVENOUS

## 2020-04-11 NOTE — Progress Notes (Signed)
PROGRESS NOTE   Christina Roberts  NIO:[id number]    DOB: 03-15-1927    DOA: 04/05/2020  PCP: Lyman Bishop, DO   I have briefly reviewed patients previous medical records in [facility].  Chief Complaint  Patient presents with  . 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  Admitted for recurrent acute lower UTI/Enterococcus UTI, complicated UTI with acute infectious encephalopathy.  Persistent hypercalcemia, unclear etiology, ongoing work-up.  Overall failure to thrive and has been progressively declining over some time.  Discussed in detail with daughter on 12/17 and requested palliative care consultation in the hospital for goals of care.   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  As per daughter, has been having recurrent UTIs for the last 10 years.  Urine culture showed Enterococcus, only 10K colonies, however she was partially treated-was on 10-day course of Keflex prior to admission.  IV ceftriaxone 12/11-12/13, IV ampicillin 12/14 >>  Reviewed Dr. Alta Corning,  Urogynecology at [facility] office notes from last visit on 12/1 in care everywhere: Chronic bladder mesh erosion with recent increased bleeding secondary to severe bladder inflammation from the mesh erosion, could not rule out concomitant bladder tumor or other lesion within the bladder on recent cystoscopy, urosepsis complicating chronic infection, recent decline in mental status.  After discussing with family, conservative and expectant management was decided.  Remains at risk for recurrent UTIs.  Next follow-up on 12/29.  Therapies have evaluated and recommend SNF.  Daughter considering same.   As per chart review, patient followed by a [facility]-community-based palliative care.  I discussed with ID MD on call 12/17.  Given ongoing leukocytosis, worse than baseline, chronic foreign body in bladder, recommended getting CT abdomen with contrast, daughter agreeable.  ID also recommended continuing IV ampicillin while inpatient and could consider amoxicillin/Augmentin suppressive treatment at discharge.  Hypercalcemia  Unclear etiology.  Could also be contributing to her mental status changes.  Corrected serum calcium between 12.3-12.7 (for albumin of 2.3)  IV normal saline hydration and follow CMP in a.m.  PTH intact: 5 (low).  Vitamin D, 25-hydroxy: 88.07 (normal)  Vitamin D, 1, 25, hydroxy and PTH RP pending.  Trial of nasal calcitonin.  May need Zometa.  I discussed with Nephrology MD on call on 12/16.  Mild hypercalcemia is new and worse than her baseline.  Concern for underlying malignancy.  He recommended checking SPEP, free light chains, IV Lasix 20 mg x 1, stopping IV fluids.  Recent chest x-ray 12/12 without acute findings.  Updated patient's daughter regarding my concern about underlying malignancy.   Persistent leukocytosis  Management as noted above.  Follow CBC  Hypokalemia:  Replaced  Acute encephalopathy, infectious etiology  Due to UTI and dehydration.  As per  sister, no formal diagnosis of dementia but may have some degree of age-related cognitive impairment or dementia now complicated by acute infectious etiology.  Delirium precautions and minimize opioids/sedatives as much as possible.  As per my discussion with patient's daughter on 12/17, she was visiting patient in the room, mental status improved by about 50-60%.  Ongoing poor oral intake.  Type II DM, controlled  A1c 5.5 on 04/06/2020 indicating excellent control.  Discontinued SSI, continue CBG monitoring.  Holding Metformin.  Essential hypertension  Controlled on Toprol-XL, continue.  Mildly uncontrolled at times  Hyperlipidemia:  Continue statins.  Severe iron deficiency anemia:  May be related to chronic hematuria related to erosion of bladder suspension mesh into bladder wall.  Anemia panel 12/12: Iron 20, TIBC 241, saturation ratio 8, ferritin 56, folate 68.6, B12: 359.  S/p dose of IV iron on 12/14, next dose supposed to be for today.  Hemoglobin slowly drifting down/7.9 today.  Follow CBC in a.m. and consider transfusing if hemoglobin 7 g or less  Adult failure to thrive:  Multifactorial due to very advanced age, multiple severe significant and some irreversible comorbidities, frailty, acute/subacute AMS complicating possible underlying dementia.  Ongoing poor oral intake.  Followed by outpatient palliative care.  After discussing with daughter, will consult inpatient palliative care for establishing goals of care.   Body mass index is 20.98 kg/m.    DVT prophylaxis: SCDs Start: 04/06/20 0457     Code Status: DNR Family Communication: Discussed in detail with patient's daughter via phone on 04/09/2020, updated care and answered questions.  None at bedside today.  Disposition:  Status is: Inpatient  Remains inpatient appropriate because:Inpatient level of care appropriate due to severity of illness   Dispo: The patient is from: Home               Anticipated d/c is to: SNF              Anticipated d/c date is: 2 days              Patient currently is not medically stable to d/c.        Consultants:   Palliative care team  Procedures:   None  Antimicrobials:    Anti-infectives (From admission, onward)   Start     Dose/Rate Route Frequency Ordered Stop   04/08/20 1400  ampicillin (OMNIPEN) 1 g in sodium chloride 0.9 % 100 mL IVPB        1 g 300 mL/hr over 20 Minutes Intravenous Every 8 hours 04/08/20 1233     04/07/20 0415  cefTRIAXone (ROCEPHIN) 1 g in sodium chloride 0.9 % 100 mL IVPB  Status:  Discontinued        1 g 200 mL/hr over 30 Minutes Intravenous Every 24 hours 04/06/20 0431 04/08/20 1233   04/06/20 0415  cefTRIAXone (ROCEPHIN) 1 g in sodium chloride 0.9 % 100 mL IVPB        1 g 200 mL/hr over 30 Minutes Intravenous  Once 04/06/20 0405 04/06/20 0639        Subjective:  Patient seen this morning.  No family at bedside.  Reports headache, neck pain, back pain.  Appears alert and oriented at least to person and place.  Objective:   Vitals:   04/10/20 1503 04/10/20 2037 04/11/20 0531 04/11/20 0851  BP: (!) 142/73 (!) 168/78 (!) 152/78 (!) 167/81  Pulse: 92 89  79 79  Resp: 18 18 17 18   Temp: (!) 97.4 F (36.3 C) 98.9 F (37.2 C)  98.5 F (36.9 C)  TempSrc: Oral Oral  Oral  SpO2: 97% 96% 97% 96%  Weight:      Height:        General exam: Pleasant elderly female, moderately built and thinly nourished lying comfortably propped up in bed without distress.  Oral mucosa moist Respiratory system: Clear to auscultation.  No increased work of breathing. Cardiovascular system: S1 & S2 heard, RRR. No JVD, murmurs, rubs, gallops or clicks. No pedal edema. Gastrointestinal system: Abdomen is nondistended, soft and nontender. No organomegaly or masses felt. Normal bowel sounds heard. Central nervous system: Alert and oriented x2.  Follows simple instructions.  No focal neurological deficits.  Extremities:  Symmetric 5 x 5 power. Skin: No rashes, lesions or ulcers Psychiatry: Judgement and insight impaired. Mood & affect flat.     Data Reviewed:   I have personally reviewed following labs and imaging studies   CBC: Recent Labs  Lab 04/05/20 2206 04/06/20 0515 04/09/20 0357 04/10/20 0412 04/11/20 0055  WBC 14.8*   < > 18.9* 17.5* 20.4*  NEUTROABS 10.6*  --   --   --   --   HGB 8.1*   < > 9.1* 8.5* 7.9*  HCT 27.2*   < > 29.7* 27.0* 24.9*  MCV 84.0   < > 82.7 83.1 82.2  PLT 506*   < > 476* 397 462*   < > = values in this interval not displayed.    Basic Metabolic Panel: Recent Labs  Lab 04/09/20 0357 04/09/20 1239 04/10/20 0412 04/11/20 0055  NA 138  --  142 139  K 3.6  --  3.3* 3.5  CL 101  --  107 106  CO2 25  --  23 25  GLUCOSE 83  --  89 107*  BUN 11  --  11 10  CREATININE 1.05*  --  0.96 0.89  CALCIUM 11.6* 11.3* 11.3* 11.4*    Liver Function Tests: Recent Labs  Lab 04/05/20 2206 04/09/20 1239 04/10/20 0412 04/11/20 0055  AST 20  --  19 18  ALT 13  --  13 14  ALKPHOS 73  --  72 74  BILITOT 0.6  --  0.7 1.0  PROT 6.5  --  5.7* 5.8*  ALBUMIN 2.6* 2.3* 2.3* 2.3*    CBG: Recent Labs  Lab 04/10/20 1657 04/10/20 2321 04/11/20 0629  GLUCAP 110* 97 81    Microbiology Studies:   Recent Results (from the past 240 hour(s))  Urine culture     Status: Abnormal   Collection Time: 04/06/20  1:00 AM   Specimen: Urine, Random  Result Value Ref Range Status   Specimen Description URINE, RANDOM  Final   Special Requests   Final    NONE Performed at [facility], [place] [address], [place], [place] [address]    Culture 10,000 COLONIES/mL ENTEROCOCCUS FAECALIS (A)  Final   Report Status 04/08/2020 FINAL  Final   Organism ID, Bacteria ENTEROCOCCUS FAECALIS (A)  Final      Susceptibility   Enterococcus faecalis - MIC*    AMPICILLIN <=2 SENSITIVE Sensitive     NITROFURANTOIN <=16 SENSITIVE Sensitive     VANCOMYCIN 1 SENSITIVE Sensitive     * 10,000  COLONIES/mL ENTEROCOCCUS FAECALIS  Resp Panel by RT-PCR (Flu A&B, Covid) Nasopharyngeal Swab     Status: None   Collection Time: 04/06/20  1:03 AM   Specimen: Nasopharyngeal Swab; Nasopharyngeal(NP) swabs in vial transport medium  Result Value Ref Range Status   SARS Coronavirus 2 by RT PCR NEGATIVE NEGATIVE Final    Comment: (NOTE) SARS-CoV-2 target nucleic acids are NOT DETECTED.  The SARS-CoV-2 RNA is generally detectable in upper respiratory specimens during the acute phase of infection. The lowest concentration of SARS-CoV-2 viral copies this assay can detect is 138 copies/mL. A negative result does not preclude SARS-Cov-2 infection and should not be used as the sole basis for treatment or other patient management decisions. A negative result may occur with  improper specimen collection/handling, submission of specimen other than nasopharyngeal swab, presence of viral mutation(s) within the areas targeted by this assay, and inadequate number of viral copies(<138 copies/mL). A negative result must be combined with clinical observations, patient history, and epidemiological information. The expected result is Negative.  Fact Sheet for Patients:  EntrepreneurPulse.com.au  Fact Sheet for Healthcare Providers:  IncredibleEmployment.be  This test is no t yet approved or cleared by the [place] FDA and  has been authorized for detection and/or diagnosis of SARS-CoV-2 by FDA under an Emergency Use Authorization (EUA). This EUA will remain  in effect (meaning this test can be used) for the duration of the COVID-19 declaration under Section 564(b)(1) of the Act, 21 U.S.C.section 360bbb-3(b)(1), unless the authorization is terminated  or revoked sooner.       Influenza A by PCR NEGATIVE NEGATIVE Final   Influenza B by PCR NEGATIVE NEGATIVE Final    Comment: (NOTE) The Xpert Xpress SARS-CoV-2/FLU/RSV plus assay is intended as an aid in the  diagnosis of influenza from Nasopharyngeal swab specimens and should not be used as a sole basis for treatment. Nasal washings and aspirates are unacceptable for Xpert Xpress SARS-CoV-2/FLU/RSV testing.  Fact Sheet for Patients: EntrepreneurPulse.com.au  Fact Sheet for Healthcare Providers: IncredibleEmployment.be  This test is not yet approved or cleared by the [place] FDA and has been authorized for detection and/or diagnosis of SARS-CoV-2 by FDA under an Emergency Use Authorization (EUA). This EUA will remain in effect (meaning this test can be used) for the duration of the COVID-19 declaration under Section 564(b)(1) of the Act, 21 U.S.C. section 360bbb-3(b)(1), unless the authorization is terminated or revoked.  Performed at [facility], [place] [address], [place], [place] [address]      Radiology Studies:  No results found.   Scheduled Meds:   . aspirin  81 mg Oral Daily  . atorvastatin  20 mg Oral Daily  . calcitonin (salmon)  1 spray Alternating Nares Daily  . metoprolol succinate  12.5 mg Oral Daily  . PARoxetine  20 mg Oral Daily    Continuous Infusions:   . ampicillin (OMNIPEN) IV 1 g (04/11/20 0625)  . ferumoxytol 510 mg (04/07/20 1644)     LOS: 5 days     Vernell Leep, MD, [place], [facility]. [facility]    To contact the attending provider between 7A-7P or the covering provider during after hours 7P-7A, please log into the web site [email or web address] and access using universal [place] password for that web site. If you do not have the password, please call the hospital operator.  04/11/2020, 12:01 PM

## 2020-04-11 NOTE — Progress Notes (Signed)
[facility])  Mrs. Colquitt is our current palliative patient in the community.   Spoke with her daughters at length, provided support and answered questions.  Discussed hospice and what those next steps looks like.  They would like to wait until CT is completed and resulted prior to making any decisions. They are most interested in focusing on quality of life and comfort.  If the results are not favorable from the CT scan they would like to expedite getting her home as soon as possible to spend as much time with her as they can.  They currently have a paid caregiver that is there every day during the week from 8-4. They are looking to find additional help for the weekends.  Venia Carbon RN, BSN, [place] [facility] Liaison

## 2020-04-11 NOTE — Consult Note (Signed)
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  Admitted for recurrent acute lower UTI/Enterococcus UTI, complicated UTI with acute infectious encephalopathy.  Persistent hypercalcemia, unclear etiology, ongoing work-up.  Overall failure to thrive and has been progressively declining over some time.  Discussed in detail with daughter on 12/17 and requested palliative care consultation in the hospital for goals of care.  Clinical Assessment/Goals of Care: I have reviewed medical records including EPIC notes, labs and imaging, received report from bedside RN, assessed the patient who is laying sideways in bed in no distress. I asked if she would like me to reposition her though she states that she is comfortable.    I called Fonda Kinder to further discuss diagnosis prognosis, GOC, EOL wishes, disposition and options.   I introduced Palliative Medicine as specialized medical care for people living with serious illness. It focuses on providing relief from the symptoms and stress of a serious illness. The goal is to improve quality of life for both the patient and the family.  [name] shares that her mother is from [place] originally. She moved to [place] where she and her siblings were born and then to [place]. She is a widow - her husband passed aware in 2012. She  has four children. For occupation she worked as a Radiation protection practitioner for [employer]. She enjoys her family.   Prior to hospitalization Rosealie had been living at home with her daughter, [name] who has been helping to care for her since 2018. She has a caregiver throughout the week named Judson Roch. They help with all bADL's.  From a health perspective [name] shares that over the last six months specifically Elexia has started to declines she believes in the setting of recurrent hospitalizations. We discussed delirium and how this in and of itself can complicate a clinical picture. We discussed that some patients recover well from delirium while other plateau suffer another event and decline further.   We discussed the mesh which is eroding Juanelle's bladder and how this will continue to be a nitidus for infections. We discussed [place]s hypercalcemia and the workup being pursued for this. We also talked about failure to thrive and how in geriatric patients this can lead to multi-system failure which is in and of itself a terminal diagnosis.   A detailed discussion was had today regarding advanced directives - Living Will in [place].    Concepts specific to code status, artifical feeding and hydration, continued IV antibiotics and rehospitalization was had. Reviewed prior MOST as below:  Cardiopulmonary Resuscitation: Do Not Attempt Resuscitation (DNR/No CPR)  Medical Interventions: Limited Additional Interventions: Use medical treatment, IV fluids and cardiac monitoring as indicated, DO NOT USE intubation or mechanical ventilation. May consider use of less invasive airway support such as BiPAP or CPAP. Also provide comfort measures. Transfer to the hospital if indicated. Avoid intensive care.   Antibiotics: Antibiotics if indicated  IV Fluids: IV fluids  for a defined trial period  Feeding Tube: No feeding tube   The difference between a aggressive medical intervention path  and a palliative comfort care  path for this patient at this time was had.We discussed hospice.  I described hospice as a service for patients for have a life expectancy of < 6 months. It preserves dignity and quality at the end phases of life. The focus changes from curative to symptom relief.   At this point [name] is hopeful to get her mother home sooner than later as she firmly believes this will improve her cognitive state. She is facing some barriers with solidifying caregivers due to companies having a lack of staffing. I have requested that our TOC CM and [facility] reach out to [place].   Discussed the importance of continued conversation with family and their  medical providers regarding overall plan of care and treatment options, ensuring decisions are within the context of the patients values and GOCs.  Decision Maker: Fonda Kinder (daughter) [phone]  SUMMARY OF RECOMMENDATIONS   DNAR/DNI  DNR and MOST in [place] reaching out to family to further discuss their hospice services and provide caregiver resources  FTT - Nutrition consult, 1:1 feeding, consider swallow eval. if not improvement  Delirium precautions w/ frequent reorientation  Mobility would benefit from being OOB daily at the least  Ongoing PMT support   Code Status/Advance Care Planning: DNAR/DNI  Palliative Prophylaxis:  Oral care, Mobility, Delirium Precuations  Additional Recommendations (Limitations, Scope, Preferences):  Treat what is treatable  Psycho-social/Spiritual:   Desire for further Chaplaincy support: No  Additional Recommendations: Education on chronic comorbidities   Prognosis: Poor in the setting of FTT, Albumin 2.3, erosive bladder mesh  Discharge Planning: Discharge to home with [facility] and [facility]  Vitals:   04/11/20 0851 04/11/20 1531  BP: (!) 167/81 139/65  Pulse: 79 83  Resp: 18 18  Temp: 98.5 F (36.9 C) 98.6 F (37 C)  SpO2: 96% 96%    Intake/Output Summary (Last 24  hours) at 04/11/2020 1608 Last data filed at 04/11/2020 1300 Gross per 24 hour  Intake 1656.53 ml  Output --  Net 1656.53 ml   Last Weight  Most recent update: 04/05/2020  9:48 PM   Weight  59 kg (130 lb)           Gen:  Elderly F in NAD HEENT: Dry mucous membranes CV: Regular rate and rhythm  PULM: clear to auscultation bilaterally  ABD: soft/nontender  EXT: No edema  Neuro: Alert - disoriented  PPS: 30%   This conversation/these recommendations were discussed with patient primary care team, Dr. Algis Liming  Time In: 1530 Time Out: 1640 Total Time: 70 Greater than 50%  of this time was spent counseling and coordinating care related to the above assessment and plan.  [place] Team Team Cell Phone: [phone] Please utilize secure chat with additional questions, if there is no response within 30 minutes please call the above phone number  Palliative Medicine Team providers are available by phone from 7am to 7pm daily and can be reached through the team cell phone.  Should this patient require assistance outside of these hours, please call the patient's attending physician.

## 2020-04-12 LAB — GLUCOSE, CAPILLARY
Glucose-Capillary: 70 mg/dL (ref 70–99)
Glucose-Capillary: 112 mg/dL — ABNORMAL HIGH (ref 70–99)
Glucose-Capillary: 116 mg/dL — ABNORMAL HIGH (ref 70–99)
Glucose-Capillary: 106 mg/dL — ABNORMAL HIGH (ref 70–99)

## 2020-04-12 LAB — CBC
HCT: 25.7 % — ABNORMAL LOW (ref 36.0–46.0)
Hemoglobin: 8 g/dL — ABNORMAL LOW (ref 12.0–15.0)
MCH: 25.6 pg — ABNORMAL LOW (ref 26.0–34.0)
MCHC: 31.1 g/dL (ref 30.0–36.0)
MCV: 82.1 fL (ref 80.0–100.0)
Platelets: 460 10*3/uL — ABNORMAL HIGH (ref 150–400)
RBC: 3.13 MIL/uL — ABNORMAL LOW (ref 3.87–5.11)
RDW: 14.3 % (ref 11.5–15.5)
WBC: 20.4 10*3/uL — ABNORMAL HIGH (ref 4.0–10.5)
nRBC: 0 % (ref 0.0–0.2)

## 2020-04-12 LAB — RENAL FUNCTION PANEL
Albumin: 2.2 g/dL — ABNORMAL LOW (ref 3.5–5.0)
Anion gap: 11 (ref 5–15)
BUN: 10 mg/dL (ref 8–23)
CO2: 25 mmol/L (ref 22–32)
Calcium: 11.2 mg/dL — ABNORMAL HIGH (ref 8.9–10.3)
Chloride: 104 mmol/L (ref 98–111)
Creatinine, Ser: 0.85 mg/dL (ref 0.44–1.00)
GFR, Estimated: >60 mL/min (ref >60)
Glucose, Bld: 71 mg/dL (ref 70–99)
Phosphorus: 2.2 mg/dL — ABNORMAL LOW (ref 2.5–4.6)
Potassium: 3 mmol/L — ABNORMAL LOW (ref 3.5–5.1)
Sodium: 140 mmol/L (ref 135–145)

## 2020-04-12 LAB — MAGNESIUM: Magnesium: 1.5 mg/dL — ABNORMAL LOW (ref 1.7–2.4)

## 2020-04-12 MED ORDER — FUROSEMIDE 20 MG PO TABS
20.0000 mg | ORAL_TABLET | Freq: Every day | ORAL | Status: DC
  Administered 2020-04-12 – 2020-04-13 (×2): 20 mg via ORAL
  Filled 2020-04-12 (×2): qty 1

## 2020-04-12 MED ORDER — POTASSIUM PHOSPHATES 15 MMOLE/5ML IV SOLN
10.0000 mmol | Freq: Once | INTRAVENOUS | Status: AC
  Administered 2020-04-12: 10 mmol via INTRAVENOUS
  Filled 2020-04-12: qty 3.33

## 2020-04-12 MED ORDER — POTASSIUM CHLORIDE CRYS ER 20 MEQ PO TBCR
40.0000 meq | EXTENDED_RELEASE_TABLET | ORAL | Status: AC
  Administered 2020-04-12 (×2): 40 meq via ORAL
  Filled 2020-04-12 (×2): qty 2

## 2020-04-12 MED ORDER — ENSURE ENLIVE PO LIQD
237.0000 mL | Freq: Three times a day (TID) | ORAL | Status: DC
  Administered 2020-04-13: 237 mL via ORAL

## 2020-04-12 MED ORDER — MAGNESIUM SULFATE 4 GM/100ML IV SOLN
4.0000 g | Freq: Once | INTRAVENOUS | Status: AC
  Administered 2020-04-12: 4 g via INTRAVENOUS
  Filled 2020-04-12: qty 100

## 2020-04-12 MED ORDER — ACETAMINOPHEN 325 MG PO TABS
650.0000 mg | ORAL_TABLET | Freq: Three times a day (TID) | ORAL | Status: DC
  Administered 2020-04-12 – 2020-04-13 (×3): 650 mg via ORAL
  Filled 2020-04-12 (×3): qty 2

## 2020-04-12 NOTE — Progress Notes (Signed)
Initial Nutrition Assessment  DOCUMENTATION CODES:   Not applicable  INTERVENTION:  Provide Ensure Enlive po TID, each supplement provides 350 kcal and 20 grams of protein  Encourage adequate PO intake.   NUTRITION DIAGNOSIS:   Increased nutrient needs related to acute illness as evidenced by estimated needs.  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 Admitted for recurrent acute lower UTI/Enterococcus UTI, complicated UTI with acute infectious encephalopathy.   Per MD, pt confused. RD unable to obtain pt nutrition history at this time. Meal completion 25%. Per weight records, pt with a 18% weight loss in 2 months per weight records, which is significant for time frame. RD to order nutritional supplements to aid in caloric and protein needs.   Unable to complete Nutrition-Focused physical exam at this time.   Labs and medications reviewed.   Diet Order:   Diet Order            Diet Carb Modified Fluid consistency: Thin; Room service appropriate? Yes  Diet effective now                 EDUCATION NEEDS:   Not appropriate for education at this time  Skin:  Skin Assessment: Reviewed RN Assessment  Last BM:  12/18  Height:   Ht Readings from Last 1 Encounters:  04/05/20 5\' 6"  (1.676 m)    Weight:   Wt Readings from Last 1 Encounters:  04/05/20 59 kg   BMI:  Body mass index is 20.98 kg/m.  Estimated Nutritional Needs:   Kcal:  1500-1700  Protein:  70-80 grams  Fluid:  >/= 1.5 L/day  Corrin Parker, MS, RD, LDN RD pager  number/after hours weekend pager number on Amion.

## 2020-04-12 NOTE — Progress Notes (Signed)
[place] [facility] Consult Note Telephone: [phone]  Fax: [phone]  PATIENT NAME: Christina Roberts DOB: 05-24-1926 MRN: [id number]  PRIMARY CARE PROVIDER:   Lyman Bishop, DO  REFERRING PROVIDER:  Lyman Bishop, DO [place],  [place] [address]  RESPONSIBLE PARTYFonda Roberts Daughter   [phone]     RECOMMENDATIONS and PLAN:  Palliative care encounter  Z51.5  1.  Advance care planning: The MOST and DNR forms are on file. Goals currently are for increase of strength and mobility.  Palliative care will follow up with patient in approximately 3 weeks.  2.  Hematuria: Improved.  Improve hydration.  Monitor urinary output from foley catheter. Managed by GYNo/urologist.   Pending removal of bladder mesh.  3.  Chronic back pain:  Improved with use of hospital bed for repositioning.  Continue hydrocodone 7.5/325 mg as needed for severe pain.  Continue follow-up with pain management provider  4.  Fatigue: Multifactorial and chronic.  Activities as tolerated with assistance only. Physical therapy for gait and strengthening  I spent 40 minutes providing this consultation,  from 1130 to 1210. More than 50% of the time in this consultation was spent coordinating communication with patient, daughter and caregiver(with pt's permission).   HISTORY OF PRESENT ILLNESS: Follow-up with Christina Roberts post recent hospitalization. Daughter reports decrease of chronic back pain and hernaturia. Appetite has improved as well.  Pending procedure to remove bladder mesh. Palliative Care was asked to help address goals of care.   CODE STATUS:  DNAR/DNI  PPS: 40% weak  HOSPICE ELIGIBILITY/DIAGNOSIS: TBD  PAST MEDICAL HISTORY:  Past Medical History:  Diagnosis Date  . Closed compression fracture of body of L1 vertebra ([place])   . Diabetes mellitus without complication ([place])   . Diverticular disease   . Hiatal hernia   .  Hypertension   . Ventral hernia     SOCIAL HX: Currently living with daughter.  4 children Social History   Tobacco Use  . Smoking status: Never Smoker  . Smokeless tobacco: Never Used  Substance Use Topics  . Alcohol use: Never    ALLERGIES:  Allergies  Allergen Reactions  . Flomax [Tamsulosin Hcl] Swelling  . Fosamax [Alendronate Sodium] Swelling    Swelling of lips and nose  . Nsaids Other (See Comments)     PERTINENT MEDICATIONS:  No facility-administered encounter medications on file as of 03/18/2020.   Outpatient Encounter Medications as of 03/18/2020  Medication Sig  . aspirin 81 MG chewable tablet Chew 81 mg by mouth daily.   Marland Kitchen atorvastatin (LIPITOR) 20 MG tablet Take 20 mg by mouth daily.   . Cholecalciferol (VITAMIN D3) 50 MCG (2000 UT) TABS Take 1 tablet by mouth daily.  . furosemide (LASIX) 40 MG tablet Take 40 mg by mouth daily.   Marland Kitchen HYDROcodone-acetaminophen (NORCO) 7.5-325 MG tablet Take 0.5 tablets by mouth every 6 (six) hours as needed for pain or severe pain.  . metFORMIN (GLUCOPHAGE) 500 MG tablet Take 500 mg by mouth at bedtime.   . metoprolol succinate (TOPROL-XL) 25 MG 24 hr tablet Take 12.5 mg by mouth daily.   . Multiple Vitamins-Minerals (ONE DAILY MULTIVITAMIN WOMEN PO) Take 1 tablet by mouth daily.  Marland Kitchen PARoxetine (PAXIL) 20 MG tablet Take 20 mg by mouth daily.   Marland Kitchen PROBIOTIC PRODUCT PO Take 1 tablet by mouth daily.  . ramipril (ALTACE) 10 MG capsule Take 10 mg by mouth daily.   Marland Kitchen  Trolamine Salicylate (ASPERCREME EX) Apply 1 application topically daily.  . [DISCONTINUED] diclofenac sodium (VOLTAREN) 1 % GEL Apply 2 g topically 4 (four) times daily.  . [DISCONTINUED] gabapentin (NEURONTIN) 100 MG capsule Take 100-300 mg by mouth at bedtime.  . [DISCONTINUED] naproxen sodium (ALEVE) 220 MG tablet Take 220 mg by mouth as needed (pain).  . [DISCONTINUED] pantoprazole (PROTONIX) 40 MG tablet Take 1 tablet (40 mg total) by mouth 2 (two) times daily before a  meal. Take 2 tabs daily for 30 days then take 1 tab daily (Patient not taking: Reported on 04/06/2020)    PHYSICAL EXAM:   General: NAD, frail and fatigued appearing elderly female reclining in bed,  EENT: Pale mucosa Cardiovascular: regular rate and rhythm Pulmonary: clear ant fields Abdomen: soft, nontender, + bowel sounds GU: clear, amber urine with intermittent dark hematuria in catheter tubing Extremities: no edema, no joint deformities Skin:pale in color.  Exposed skin is intact Neurological: Alert and oriented to person and place.  Engaged Psych:  Pleasant mood

## 2020-04-12 NOTE — TOC Progression Note (Signed)
Transition of Care [facility]) - Progression Note    Patient Details  Name: Christina Roberts MRN: [id number] Date of Birth: 06-24-1926  [facility]) CM/SW Contact  Bartholomew Crews, RN Phone Number: [phone] 04/12/2020, 10:33 AM  Clinical Narrative:     Confirmed that patient is active with [facility] for RN, PT, OT. Will need home health orders for resumption of care. TOC following for transition needs.   Expected Discharge Plan: [place] Barriers to Discharge: Continued Medical Work up  Expected Discharge Plan and Services Expected Discharge Plan: [place]                                               Social Determinants of Health (SDOH) Interventions    Readmission Risk Interventions No flowsheet data found.

## 2020-04-12 NOTE — Progress Notes (Signed)
PROGRESS NOTE   Christina Roberts  OKH:[id number]    DOB: [birth year]    DOA: 04/05/2020  PCP: Lyman Bishop, DO   I have briefly reviewed patients previous medical records in [facility].  Chief Complaint  Patient presents with  . Generalized Weakness    Brief Narrative:  86 year old female with past medical history of diabetes mellitus, hypertension, bladder suspension with the mesh which has eroded into the bladder, recurrent UTIs, recently hospitalized at [facility] for UTI, hematuria and encephalopathy, she was treated with antibiotics, required a Foley catheter at discharge, seen by urogynecology at follow-up on 03/26/2020, decision was made not to pursue surgery due to advanced age and she was discharged on a course of Keflex, now presented to the ED with generalized weakness, hallucinations and confusion that started 5 days prior.  Admitted for recurrent acute lower UTI/Enterococcus UTI, complicated UTI with acute infectious encephalopathy.  Persistent hypercalcemia, unclear etiology, ongoing work-up.  Overall failure to thrive and has been progressively declining over some time.  Discussed in detail with daughter on 12/17 and requested palliative care consultation in the hospital for goals of care.  Daughter now wishes to take patient home either 12/19 or 12/20 with palliative care follow-up.   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  As per daughter, has been having recurrent UTIs for the last 10 years.  Urine culture showed Enterococcus, only 10K colonies, however she was partially treated-was on 10-day course of Keflex prior to  admission.  IV ceftriaxone 12/11-12/13, IV ampicillin 12/14 >>  Reviewed Dr. Alta Corning, Urogynecology at [facility] office notes from last visit on 12/1 in care everywhere: Chronic bladder mesh erosion with recent increased bleeding secondary to severe bladder inflammation from the mesh erosion, could not rule out concomitant bladder tumor or other lesion within the bladder on recent cystoscopy, urosepsis complicating chronic infection, recent decline in mental status.  After discussing with family, conservative and expectant management was decided.  Remains at risk for recurrent UTIs.  Next follow-up on 12/29.  Therapies have evaluated and recommend SNF.  Daughter considering same.   As per chart review, patient followed by a [facility]-community-based palliative care.  I discussed with ID MD on call 12/17.  Given ongoing leukocytosis, worse than baseline, chronic foreign body in bladder, recommended getting CT abdomen with contrast to evaluate for infectious source or complications i.e. abscess etc.  ID also recommended continuing IV ampicillin while inpatient and could consider amoxicillin/Augmentin suppressive treatment at discharge.  CT abdomen results reviewed.  Reported as bladder tumor/malignancy.  However had urology see patient who feels that this is not malignancy but rather inflammatory issue although cannot definitively rule out malignancy.  Persisting leukocytosis, 20 K.  Plan will be to DC patient on oral amoxicillin or Augmentin for 10 to 14 days.  Hypercalcemia  Unclear etiology.  Could also be contributing to her mental status changes.  Corrected serum calcium between 12.3-12.7 (for albumin of 2.3)  IV normal saline hydration and follow CMP in a.m.  PTH intact: 5 (low).  Vitamin D, 25-hydroxy: 88.07 (normal)  Vitamin D, 1, 25, hydroxy and PTH RP pending.  Trial of nasal calcitonin.  May need Zometa.  I discussed with Nephrology MD on call on 12/16.  Mild  hypercalcemia is new and worse than her baseline.  Concern for underlying malignancy.  He recommended checking SPEP, free light chains, IV Lasix 20 mg x 1, stopping IV fluids.  Recent chest x-ray 12/12 without acute findings.  Updated patient's daughter regarding my concern about underlying malignancy.  No change in hypercalcemia despite a dose of IV Lasix.  Unable to use bisphosphonate due to listed allergy of swelling of lips/nose with alendronate.  As per my discussion with daughter, patient's lips "blew up like a duck".  As discussed with nephrology again on 12/18, calcitonin nasal while hospitalized only, Lasix 20 mg orally daily at discharge and no further evaluation or management.  Persistent leukocytosis  Management as noted above.  Follow CBC  Hypokalemia/hypomagnesemia:  Replace and follow.  Likely secondary to IV Lasix.  Acute encephalopathy, infectious etiology  Due to UTI and dehydration.  As per sister, no formal diagnosis of dementia but may have some degree of age-related cognitive impairment or dementia now complicated by acute infectious etiology.  Delirium precautions and minimize opioids/sedatives as much as possible.  As per my discussion with patient's daughter on 12/18, seems to be doing better today, ate much better and mental status more clear.  Type II DM, controlled  A1c 5.5 on 04/06/2020 indicating excellent control.  Discontinued SSI, continue CBG monitoring.  Holding Metformin.  Essential hypertension  Controlled on Toprol-XL, continue.  Mildly uncontrolled at times  Hyperlipidemia:  Continue statins.  Severe iron deficiency anemia:  May be related to chronic hematuria related to erosion of bladder suspension mesh into bladder wall.  Anemia panel 12/12: Iron 20, TIBC 241, saturation ratio 8, ferritin 56, folate 68.6, B12: 359.  S/p dose of IV iron on 12/14, next dose supposed to be a week from prior dose.  Hemoglobin slowly drifting  down/7.9 today.  Hemoglobin 8.  Follow CBC in a.m. and consider transfusing if hemoglobin 7 g or less  Adult failure to thrive:  Multifactorial due to very advanced age, multiple severe significant and some irreversible comorbidities, frailty, acute/subacute AMS complicating possible underlying dementia.  Ongoing poor oral intake.  Followed by outpatient palliative care.  After discussing with daughter, palliative care consulted and their input appreciated.  As discussed with daughter today, she does not want patient to go to SNF, prefers to go home with palliative (not hospice) since she will get some home health therapies and believes that patient may make some improvement.  She is open to transitioning to hospice from home if she does not improve.  She and her sister are working with the home health company to find extended coverage.  She requests possible discharge tomorrow or the day after.   Body mass index is 20.98 kg/m.    DVT prophylaxis: SCDs Start: 04/06/20 0457     Code Status: DNR Family Communication: Discussed in detail with patient's daughter via phone on 04/12/2020 updated care and answered questions.  Disposition:  Status is: Inpatient  Remains inpatient appropriate because:Inpatient level of care appropriate due to severity of illness   Dispo: The patient is from: Home              Anticipated d/c is to: SNF              Anticipated d/c date is: 2 days              Patient currently is not medically stable to  d/c.        Consultants:   Palliative care team Urology  Procedures:   None  Antimicrobials:    Anti-infectives (From admission, onward)   Start     Dose/Rate Route Frequency Ordered Stop   04/08/20 1400  ampicillin (OMNIPEN) 1 g in sodium chloride 0.9 % 100 mL IVPB        1 g 300 mL/hr over 20 Minutes Intravenous Every 8 hours 04/08/20 1233     04/07/20 0415  cefTRIAXone (ROCEPHIN) 1 g in sodium chloride 0.9 % 100 mL IVPB  Status:   Discontinued        1 g 200 mL/hr over 30 Minutes Intravenous Every 24 hours 04/06/20 0431 04/08/20 1233   04/06/20 0415  cefTRIAXone (ROCEPHIN) 1 g in sodium chloride 0.9 % 100 mL IVPB        1 g 200 mL/hr over 30 Minutes Intravenous  Once 04/06/20 0405 04/06/20 0639        Subjective:  When seen this morning, "everything hurts", "did not sleep".  Oriented only to self and appeared somewhat confused.  Subsequently daughter indicated that her mental status was much better today and she ate much better.  Objective:   Vitals:   04/11/20 1531 04/11/20 2020 04/12/20 0332 04/12/20 0834  BP: 139/65 (!) 144/82 (!) 150/74 127/73  Pulse: 83 88 88 (!) 101  Resp: 18 17 17 18   Temp: 98.6 F (37 C) 99.2 F (37.3 C) 98.6 F (37 C) 98.6 F (37 C)  TempSrc: Oral Oral Oral Oral  SpO2: 96% 96% 97% 97%  Weight:      Height:        General exam: Pleasant elderly female, moderately built and thinly nourished, chronically ill looking, sitting up in bed with breakfast tray in front of her. Respiratory system: Clear to auscultation.  No increased work of breathing. Cardiovascular system: S1 & S2 heard, RRR. No JVD, murmurs, rubs, gallops or clicks. No pedal edema. Gastrointestinal system: Abdomen is nondistended, soft and nontender. No organomegaly or masses felt. Normal bowel sounds heard. Central nervous system: Alert and oriented only to self.  No focal neurological deficits. Extremities: Symmetric 5 x 5 power. Skin: No rashes, lesions or ulcers Psychiatry: Judgement and insight impaired. Mood & affect flat.     Data Reviewed:   I have personally reviewed following labs and imaging studies   CBC: Recent Labs  Lab 04/05/20 2206 04/06/20 0515 04/10/20 0412 04/11/20 0055 04/12/20 0106  WBC 14.8*   < > 17.5* 20.4* 20.4*  NEUTROABS 10.6*  --   --   --   --   HGB 8.1*   < > 8.5* 7.9* 8.0*  HCT 27.2*   < > 27.0* 24.9* 25.7*  MCV 84.0   < > 83.1 82.2 82.1  PLT 506*   < > 397 462* 460*    < > = values in this interval not displayed.    Basic Metabolic Panel: Recent Labs  Lab 04/10/20 0412 04/11/20 0055 04/12/20 0106  NA 142 139 140  K 3.3* 3.5 3.0*  CL 107 106 104  CO2 23 25 25   GLUCOSE 89 107* 71  BUN 11 10 10   CREATININE 0.96 0.89 0.85  CALCIUM 11.3* 11.4* 11.2*  MG  --   --  1.5*  PHOS  --   --  2.2*    Liver Function Tests: Recent Labs  Lab 04/05/20 2206 04/09/20 1239 04/10/20 0412 04/11/20 0055 04/12/20 0106  AST 20  --  19 18  --   ALT 13  --  13 14  --   ALKPHOS 73  --  72 74  --   BILITOT 0.6  --  0.7 1.0  --   PROT 6.5  --  5.7* 5.8*  --   ALBUMIN 2.6*   < > 2.3* 2.3* 2.2*   < > = values in this interval not displayed.    CBG: Recent Labs  Lab 04/11/20 2019 04/12/20 0602 04/12/20 1148  GLUCAP 73 70 112*    Microbiology Studies:   Recent Results (from the past 240 hour(s))  Urine culture     Status: Abnormal   Collection Time: 04/06/20  1:00 AM   Specimen: Urine, Random  Result Value Ref Range Status   Specimen Description URINE, RANDOM  Final   Special Requests   Final    NONE Performed at [facility], [place] [address] [place], [place] [address]    Culture 10,000 COLONIES/mL ENTEROCOCCUS FAECALIS (A)  Final   Report Status 04/08/2020 FINAL  Final   Organism ID, Bacteria ENTEROCOCCUS FAECALIS (A)  Final      Susceptibility   Enterococcus faecalis - MIC*    AMPICILLIN <=2 SENSITIVE Sensitive     NITROFURANTOIN <=16 SENSITIVE Sensitive     VANCOMYCIN 1 SENSITIVE Sensitive     * 10,000 COLONIES/mL ENTEROCOCCUS FAECALIS  Resp Panel by RT-PCR (Flu A&B, Covid) Nasopharyngeal Swab     Status: None   Collection Time: 04/06/20  1:03 AM   Specimen: Nasopharyngeal Swab; Nasopharyngeal(NP) swabs in vial transport medium  Result Value Ref Range Status   SARS Coronavirus 2 by RT PCR NEGATIVE NEGATIVE Final    Comment: (NOTE) SARS-CoV-2 target nucleic acids are NOT DETECTED.  The SARS-CoV-2 RNA is generally detectable in  upper respiratory specimens during the acute phase of infection. The lowest concentration of SARS-CoV-2 viral copies this assay can detect is 138 copies/mL. A negative result does not preclude SARS-Cov-2 infection and should not be used as the sole basis for treatment or other patient management decisions. A negative result may occur with  improper specimen collection/handling, submission of specimen other than nasopharyngeal swab, presence of viral mutation(s) within the areas targeted by this assay, and inadequate number of viral copies(<138 copies/mL). A negative result must be combined with clinical observations, patient history, and epidemiological information. The expected result is Negative.  Fact Sheet for Patients:  EntrepreneurPulse.com.au  Fact Sheet for Healthcare Providers:  IncredibleEmployment.be  This test is no t yet approved or cleared by the [place] FDA and  has been authorized for detection and/or diagnosis of SARS-CoV-2 by FDA under an Emergency Use Authorization (EUA). This EUA will remain  in effect (meaning this test can be used) for the duration of the COVID-19 declaration under Section 564(b)(1) of the Act, 21 U.S.C.section 360bbb-3(b)(1), unless the authorization is terminated  or revoked sooner.       Influenza A by PCR NEGATIVE NEGATIVE Final   Influenza B by PCR NEGATIVE NEGATIVE Final    Comment: (NOTE) The Xpert Xpress SARS-CoV-2/FLU/RSV plus assay is intended as an aid in the diagnosis of influenza from Nasopharyngeal swab specimens and should not be used as a sole basis for treatment. Nasal washings and aspirates are unacceptable for Xpert Xpress SARS-CoV-2/FLU/RSV testing.  Fact Sheet for Patients: EntrepreneurPulse.com.au  Fact Sheet for Healthcare Providers: IncredibleEmployment.be  This test is not yet approved or cleared by the [place] FDA and has been  authorized for detection  and/or diagnosis of SARS-CoV-2 by FDA under an Emergency Use Authorization (EUA). This EUA will remain in effect (meaning this test can be used) for the duration of the COVID-19 declaration under Section 564(b)(1) of the Act, 21 U.S.C. section 360bbb-3(b)(1), unless the authorization is terminated or revoked.  Performed at [facility], [place] [address] [place], [place] [address]      Radiology Studies:  CT ABDOMEN PELVIS W CONTRAST  Result Date: 04/11/2020 CLINICAL DATA:  Inpatient. Recurrent UTI. Chronic bladder mesh erosion. Admitted with altered mental status. EXAM: CT ABDOMEN AND PELVIS WITH CONTRAST TECHNIQUE: Multidetector CT imaging of the abdomen and pelvis was performed using the standard protocol following bolus administration of intravenous contrast. CONTRAST:  166mL OMNIPAQUE IOHEXOL 300 MG/ML  SOLN COMPARISON:  10/14/2018 CT abdomen/pelvis. FINDINGS: Lower chest: Trace dependent right pleural effusion with mild dependent right lung base atelectasis. Intact lower sternotomy wires. Mild cardiomegaly. Hepatobiliary: Normal liver size. No liver mass. Cholecystectomy. Bile ducts are within normal post cholecystectomy limits and are stable. CBD diameter 6 mm. Pancreas: Normal, with no mass or duct dilation. Spleen: Normal size. No mass. Adrenals/Urinary Tract: Normal adrenals. Simple 2.1 cm lower left renal cyst. Scattered subcentimeter hypodense bilateral renal cortical lesions are too small to characterize and require no follow-up. Mild bilateral hydroureteronephrosis to the level of the ureterovesical junction bilaterally. Severe masslike irregular wall thickening throughout the entire posterior and superior bladder wall with heterogeneous hyperenhancement, spanning up to 10.1 x 6.8 x 7.7 cm (series 3/image 76), new. Diffuse haziness of the perivesical fat. Stomach/Bowel: Small to moderate hiatal hernia. Otherwise normal nondistended stomach. Normal caliber  small bowel with no small bowel wall thickening. Appendix not discretely visualized. No pericecal inflammatory changes. Mild sigmoid diverticulosis. Suggestion of mild wall thickening throughout the mid to distal sigmoid colon with associated haziness of the pericolonic fat. Vascular/Lymphatic: Atherosclerotic nonaneurysmal abdominal aorta. Patent portal, splenic, hepatic and renal veins. No pathologically enlarged lymph nodes in the abdomen or pelvis. Reproductive: Status post hysterectomy, with no abnormal findings at the vaginal cuff. No adnexal mass. Other: No pneumoperitoneum, ascites or focal fluid collection. Musculoskeletal: No aggressive appearing focal osseous lesions. Chronic severe L1 vertebral compression fracture. Marked lumbar spondylosis. IMPRESSION: 1. Severe masslike irregular wall thickening and hyperenhancement throughout the entire posterior and superior bladder wall, spanning up to 10.1 x 6.8 x 7.7 cm, new, most suspicious for primary bladder malignancy. A severe cystitis is on the differential, although considered less likely. Nonspecific diffuse haziness of the perivesical fat, which could represent secondary cystitis or fat infiltration by tumor. 2. Mild bilateral hydroureteronephrosis to the level of the ureterovesical junction bilaterally, probably due to suspected bladder mass. 3. Trace dependent right pleural effusion. 4. Mild sigmoid diverticulosis. Suggestion of mild wall thickening throughout the mid to distal sigmoid colon with associated haziness of the pericolonic fat. No free air. No abscess. Findings are nonspecific, potentially reactive to the bladder process, with serosal tumor involvement not excluded. 5. No adenopathy or other findings suspicious for metastatic disease in the abdomen or pelvis. 6. Small to moderate hiatal hernia. 7. Mild cardiomegaly. 8. Chronic severe L1 vertebral compression fracture. 9. Aortic Atherosclerosis (ICD10-I70.0). Electronically Signed   By:  Ilona Sorrel M.D.   On: 04/11/2020 19:47     Scheduled Meds:   . acetaminophen  650 mg Oral TID  . aspirin  81 mg Oral Daily  . atorvastatin  20 mg Oral Daily  . calcitonin (salmon)  1 spray Alternating Nares Daily  . metoprolol  succinate  12.5 mg Oral Daily  . PARoxetine  20 mg Oral Daily    Continuous Infusions:   . ampicillin (OMNIPEN) IV 1 g (04/12/20 0600)  . ferumoxytol 510 mg (04/07/20 1644)  . potassium PHOSPHATE IVPB (in mmol) 10 mmol (04/12/20 1049)     LOS: 6 days     Vernell Leep, MD, [place], [facility]. [facility]    To contact the attending provider between 7A-7P or the covering provider during after hours 7P-7A, please log into the web site [email or web address] and access using universal [place] password for that web site. If you do not have the password, please call the hospital operator.  04/12/2020, 2:10 PM

## 2020-04-12 NOTE — TOC Progression Note (Signed)
Transition of Care [facility]) - Progression Note    Patient Details  Name: Christina Roberts MRN: [id number] Date of Birth: 02-Jul-1926  Transition of Care [facility]) CM/SW [place], [place] Phone Number: 04/12/2020, 12:54 PM  Clinical Narrative:     CSW spoke with pt's daughter concerning possible SNF choices.  Pt's daughter [name] would like for pt to return home with additional supports in place for pt instead of going to a SNF at this time.    Expected Discharge Plan: [place] Barriers to Discharge: Continued Medical Work up  Expected Discharge Plan and Services Expected Discharge Plan: [place]                                               Social Determinants of Health (SDOH) Interventions    Readmission Risk Interventions No flowsheet data found.

## 2020-04-12 NOTE — Progress Notes (Addendum)
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 Admitted for recurrent acute lower UTI/Enterococcus UTI, complicated UTI with acute infectious encephalopathy. Persistent hypercalcemia, unclear etiology, ongoing work-up.Overall failure to thrive and has been progressively declining over some time. Discussed in detail with daughter on 12/17 and requested palliative care consultation in the hospital for goals of care.  Today's Discussion (04/12/2020): Chart reviewed.  I met with Tiffaney at bedside.  She was sitting upright in bed eating [place] toast.  She complained of some lower back pain.  Tylenol frequency which changed to around-the-clock to aid in supporting this.  She remains disoriented to time and place though is alert and oriented to person.  CT scan results are back concerning for possible metastatic bladder tumor.  Dr. Algis Liming  plans to involve urology.  Depending upon their evaluation we will further discuss concepts of hospice with the patient's family.  [facility] was able to reach out to patient's daughter [name] yesterday to provide additional information on their services.  Questions and concerns addressed   Objective Assessment: Vital Signs Vitals:   04/12/20 0332 04/12/20 0834  BP: (!) 150/74 127/73  Pulse: 88 (!) 101  Resp: 17 18   Temp: 98.6 F (37 C) 98.6 F (37 C)  SpO2: 97% 97%    Intake/Output Summary (Last 24 hours) at 04/12/2020 0854 Last data filed at 04/12/2020 0700 Gross per 24 hour  Intake 1006.98 ml  Output 1150 ml  Net -143.02 ml   Last Weight  Most recent update: 04/05/2020  9:48 PM   Weight  59 kg (130 lb)           Gen:  Elderly F in NAD HEENT: Dry mucous membranes CV: Regular rate and rhythm  PULM: clear to auscultation bilaterally  ABD: soft/nontender  EXT: No edema  Neuro: Alert to self  SUMMARY OF RECOMMENDATIONS DNAR/DNI  DNR and MOST in Vynca  FTT - Nutrition consult, 1:1 feeding, consider swallow eval. if no improvement  Delirium precautions w/ frequent reorientation  Mobility would benefit from being OOB daily at the least  Back Pain - tylenol atc  Already a part of [facility] palliative care  Ongoing PMT support pending additional diagnostics  Time Spent: 15 Greater than 50% of the time was spent in counseling and coordination of care ______________________________________________________________________________________ Addendum:  Per conversations with Dr. Algis Liming review of urology notes patients abnormal CT thought to be in the setting of significant inflammation as opposed to cancer. Plan for patient to transition home with home health and OP palliative support. If she continues to decline patients family has the information to pursue hospice care.  [place] Team Team Cell Phone: [phone] Please utilize secure chat with additional questions, if there is no response within 30 minutes please call the above phone number  Palliative Medicine Team providers are available by phone from 7am to 7pm daily and can be  reached through the team cell phone.  Should this patient require assistance outside of these hours, please call the patient's attending physician.     

## 2020-04-12 NOTE — TOC Progression Note (Signed)
Transition of Care [facility]) - Progression Note    Patient Details  Name: Faryn Sieg MRN: [id number] Date of Birth: February 09, 1927  Transition of Care [facility]) CM/SW Contact  Bartholomew Crews, RN Phone Number: [phone] 04/12/2020, 9:43 AM  Clinical Narrative:     Notified by palliative NP that daughter was requesting additional caregiver resources. Spoke with daughter, [name], on the phone. The plan is for patient to return home with continued caregiver assistance. Currently [facility] provides home care services M-F 8a-4p. Family would like additional services from 4p-8p and possibly some weekends, however, [facility] is not able to accommodate these needs. Discussed caregiver services with [facility] as an adjunct to current caregiver services. [name] is interested in being referred and agreed to patient H&P, demographics being faxed to [facility]. Spoke with Corliss Blacker to provide referral information and verified fax number.   Patient has all needed DME except hoyer lift. [name] has not confirmed if she wants hoyer lift yet.   Patient will need ambulance transport at discharge.   Currently followed by [facility] for palliative services.   Previously followed by [place]. Spoke with Corene Cornea at [facility] to inquire if active - pending follow up.   TOC following for transition needs.   Expected Discharge Plan: [place] Barriers to Discharge: Continued Medical Work up  Expected Discharge Plan and Services Expected Discharge Plan: [place]                                               Social Determinants of Health (SDOH) Interventions    Readmission Risk Interventions No flowsheet data found.

## 2020-04-12 NOTE — Progress Notes (Signed)
Pharmacy Consult: Phosphorus Replacement  Assessment: 89YF patient admitted for complications 2/2 bladder suspension with mesh and likely bladder mass. Currently on ampicillin for recurrent UTIs. Family discussing [place] with palliative care and may be transitioning to hospice. Pharmacy consulted for phosphorus replacement. PO4 2.2 today along with K 3.0. K 40 mEq x2 ordered this morning.  Will dose potassium phosphate ~0.16 mmol/kg based on PO4 2.2. Administer 10 mmol KPO4 x1 and follow-up with AM levels if patient remains inpatient with current goals of therapy. Will provide ~15 mEq K which is appropriate. Magnesium level is also pending this AM.  Plan: Administer 10 mmol KPO4 Follow-up repeat K, PO4, Mg levels  Alfonse Spruce, PharmD PGY2 ID Pharmacy Resident Phone between 7 am - 3:30 pm: [phone]  Please check AMION for all [place] phone numbers After 10:00 PM, call [place] [phone]

## 2020-04-12 NOTE — Consult Note (Signed)
Urology Consult   Physician requesting consult: Vernell Leep, MD  Reason for consult: Bladder abnormality on CT  History of Present Illness: Christina Roberts is a 86 y.o. female with a longstanding urologic history. At least 10 years ago, perhaps more she underwent prolapse repair with mesh and apparently a mid urethral sling. The patient has had problems since then from erosion of her mesh with recurrent urinary tract infections. She was seen twice in our office 3 years ago by Dr. Wendy Poet for these infections. More recently, she is seen Dr. [place] Pink at [facility] for her recurrent urinary tract infections as well as this mesh erosion. She had cystoscopy at that time with resultant urosepsis. She had a catheter in place for short period of time but that was removed due to significant bladder spasms.  The patient was admitted 7 days ago with generalized weakness with hallucinations. Her urine culture grew Enterococcus. She was found to have hypercalcemia which unfortunately has not normalized with gentle treatment. Because of this, she underwent CT of the abdomen and pelvis yesterday which revealed an apparent bladder mass, mild hydronephrosis. Because of this, urologic consultation is requested.  The patient is minimally conversive. She denies blood in her urine recently. She did have significant vaginal bleeding 2 to 3 months ago before her visit with Dr. Zigmund Daniel.   Past Medical History:  Diagnosis Date  . Closed compression fracture of body of L1 vertebra ([place])   . Diabetes mellitus without complication ([place])   . Diverticular disease   . Hiatal hernia   . Hypertension   . Ventral hernia     Past Surgical History:  Procedure Laterality Date  . ABDOMINAL HYSTERECTOMY    . CHOLECYSTECTOMY    . HERNIA REPAIR       Current Hospital Medications: Scheduled Meds: . acetaminophen  650 mg Oral TID  . aspirin  81 mg Oral Daily  .  atorvastatin  20 mg Oral Daily  . calcitonin (salmon)  1 spray Alternating Nares Daily  . metoprolol succinate  12.5 mg Oral Daily  . PARoxetine  20 mg Oral Daily  . potassium chloride  40 mEq Oral Q4H   Continuous Infusions: . ampicillin (OMNIPEN) IV 1 g (04/12/20 0600)  . ferumoxytol 510 mg (04/07/20 1644)  . potassium PHOSPHATE IVPB (in mmol)     PRN Meds:.HYDROcodone-acetaminophen, melatonin, ondansetron **OR** ondansetron (ZOFRAN) IV  Allergies:  Allergies  Allergen Reactions  . Flomax [Tamsulosin Hcl] Swelling  . Fosamax [Alendronate Sodium] Swelling    Swelling of lips and nose  . Nsaids Other (See Comments)    No family history on file.  Social History:  reports that she has never smoked. She has never used smokeless tobacco. She reports that she does not drink alcohol and does not use drugs.  ROS: Patient quite lethargic and unable to give a full review of systems  Physical Exam:  Vital signs in last 24 hours: Temp:  [98.6 F (37 C)-99.2 F (37.3 C)] 98.6 F (37 C) (12/18 0834) Pulse Rate:  [83-101] 101 (12/18 0834) Resp:  [17-18] 18 (12/18 0834) BP: (127-150)/(65-82) 127/73 (12/18 0834) SpO2:  [96 %-97 %] 97 % (12/18 0834) General: She is awake but somewhat lethargic. HEENT: Normocephalic, atraumatic Neck: No JVD or lymphadenopathy Cardiovascular: Regular rate  Lungs: Normal inspiratory/expiratory excursion Abdomen: Soft, nontender, nondistended, no abdominal masses. I cannot palpate the bladder Extremities: No edema Neurologic: Grossly intact  Laboratory Data:  Recent Labs    04/10/20 [phone]  04/11/20 0055 04/12/20 0106  WBC 17.5* 20.4* 20.4*  HGB 8.5* 7.9* 8.0*  HCT 27.0* 24.9* 25.7*  PLT 397 462* 460*    Recent Labs    04/09/20 1239 04/10/20 0412 04/11/20 0055 04/12/20 0106  NA  --  142 139 140  K  --  3.3* 3.5 3.0*  CL  --  107 106 104  GLUCOSE  --  89 107* 71  BUN  --  11 10 10   CALCIUM 11.3* 11.3* 11.4* 11.2*  CREATININE  --  0.96  0.89 0.85     Results for orders placed or performed during the hospital encounter of 04/05/20 (from the past 24 hour(s))  Glucose, capillary     Status: None   Collection Time: 04/11/20 12:17 PM  Result Value Ref Range   Glucose-Capillary 91 70 - 99 mg/dL  Glucose, capillary     Status: None   Collection Time: 04/11/20  4:03 PM  Result Value Ref Range   Glucose-Capillary 78 70 - 99 mg/dL  Glucose, capillary     Status: None   Collection Time: 04/11/20  8:19 PM  Result Value Ref Range   Glucose-Capillary 73 70 - 99 mg/dL  CBC     Status: Abnormal   Collection Time: 04/12/20  1:06 AM  Result Value Ref Range   WBC 20.4 (H) 4.0 - 10.5 K/uL   RBC 3.13 (L) 3.87 - 5.11 MIL/uL   Hemoglobin 8.0 (L) 12.0 - 15.0 g/dL   HCT 25.7 (L) 36.0 - 46.0 %   MCV 82.1 80.0 - 100.0 fL   MCH 25.6 (L) 26.0 - 34.0 pg   MCHC 31.1 30.0 - 36.0 g/dL   RDW 14.3 11.5 - 15.5 %   Platelets 460 (H) 150 - 400 K/uL   nRBC 0.0 0.0 - 0.2 %  Renal function panel     Status: Abnormal   Collection Time: 04/12/20  1:06 AM  Result Value Ref Range   Sodium 140 135 - 145 mmol/L   Potassium 3.0 (L) 3.5 - 5.1 mmol/L   Chloride 104 98 - 111 mmol/L   CO2 25 22 - 32 mmol/L   Glucose, Bld 71 70 - 99 mg/dL   BUN 10 8 - 23 mg/dL   Creatinine, Ser 0.85 0.44 - 1.00 mg/dL   Calcium 11.2 (H) 8.9 - 10.3 mg/dL   Phosphorus 2.2 (L) 2.5 - 4.6 mg/dL   Albumin 2.2 (L) 3.5 - 5.0 g/dL   GFR, Estimated >60 >60 mL/min   Anion gap 11 5 - 15  Magnesium     Status: Abnormal   Collection Time: 04/12/20  1:06 AM  Result Value Ref Range   Magnesium 1.5 (L) 1.7 - 2.4 mg/dL  Glucose, capillary     Status: None   Collection Time: 04/12/20  6:02 AM  Result Value Ref Range   Glucose-Capillary 70 70 - 99 mg/dL   Recent Results (from the past 240 hour(s))  Urine culture     Status: Abnormal   Collection Time: 04/06/20  1:00 AM   Specimen: Urine, Random  Result Value Ref Range Status   Specimen Description URINE, RANDOM  Final   Special  Requests   Final    NONE Performed at [facility], [address] [place], [place] [address]    Culture 10,000 COLONIES/mL ENTEROCOCCUS FAECALIS (A)  Final   Report Status 04/08/2020 FINAL  Final   Organism ID, Bacteria ENTEROCOCCUS FAECALIS (A)  Final  Susceptibility   Enterococcus faecalis - MIC*    AMPICILLIN <=2 SENSITIVE Sensitive     NITROFURANTOIN <=16 SENSITIVE Sensitive     VANCOMYCIN 1 SENSITIVE Sensitive     * 10,000 COLONIES/mL ENTEROCOCCUS FAECALIS  Resp Panel by RT-PCR (Flu A&B, Covid) Nasopharyngeal Swab     Status: None   Collection Time: 04/06/20  1:03 AM   Specimen: Nasopharyngeal Swab; Nasopharyngeal(NP) swabs in vial transport medium  Result Value Ref Range Status   SARS Coronavirus 2 by RT PCR NEGATIVE NEGATIVE Final    Comment: (NOTE) SARS-CoV-2 target nucleic acids are NOT DETECTED.  The SARS-CoV-2 RNA is generally detectable in upper respiratory specimens during the acute phase of infection. The lowest concentration of SARS-CoV-2 viral copies this assay can detect is 138 copies/mL. A negative result does not preclude SARS-Cov-2 infection and should not be used as the sole basis for treatment or other patient management decisions. A negative result may occur with  improper specimen collection/handling, submission of specimen other than nasopharyngeal swab, presence of viral mutation(s) within the areas targeted by this assay, and inadequate number of viral copies(<138 copies/mL). A negative result must be combined with clinical observations, patient history, and epidemiological information. The expected result is Negative.  Fact Sheet for Patients:  EntrepreneurPulse.com.au  Fact Sheet for Healthcare Providers:  IncredibleEmployment.be  This test is no t yet approved or cleared by the [place] FDA and  has been authorized for detection and/or diagnosis of SARS-CoV-2 by FDA under an Emergency Use  Authorization (EUA). This EUA will remain  in effect (meaning this test can be used) for the duration of the COVID-19 declaration under Section 564(b)(1) of the Act, 21 U.S.C.section 360bbb-3(b)(1), unless the authorization is terminated  or revoked sooner.       Influenza A by PCR NEGATIVE NEGATIVE Final   Influenza B by PCR NEGATIVE NEGATIVE Final    Comment: (NOTE) The Xpert Xpress SARS-CoV-2/FLU/RSV plus assay is intended as an aid in the diagnosis of influenza from Nasopharyngeal swab specimens and should not be used as a sole basis for treatment. Nasal washings and aspirates are unacceptable for Xpert Xpress SARS-CoV-2/FLU/RSV testing.  Fact Sheet for Patients: EntrepreneurPulse.com.au  Fact Sheet for Healthcare Providers: IncredibleEmployment.be  This test is not yet approved or cleared by the [place] FDA and has been authorized for detection and/or diagnosis of SARS-CoV-2 by FDA under an Emergency Use Authorization (EUA). This EUA will remain in effect (meaning this test can be used) for the duration of the COVID-19 declaration under Section 564(b)(1) of the Act, 21 U.S.C. section 360bbb-3(b)(1), unless the authorization is terminated or revoked.  Performed at [facility], [place] [address] [place], [place] [address]     Renal Function: Recent Labs    04/06/20 0515 04/07/20 0048 04/08/20 0347 04/09/20 0357 04/10/20 0412 04/11/20 0055 04/12/20 0106  CREATININE 1.19* 0.97 1.10* 1.05* 0.96 0.89 0.85   Estimated Creatinine Clearance: 40.2 mL/min (by C-G formula based on SCr of 0.85 mg/dL).  Radiologic Imaging: CT ABDOMEN PELVIS W CONTRAST  Result Date: 04/11/2020 CLINICAL DATA:  Inpatient. Recurrent UTI. Chronic bladder mesh erosion. Admitted with altered mental status. EXAM: CT ABDOMEN AND PELVIS WITH CONTRAST TECHNIQUE: Multidetector CT imaging of the abdomen and pelvis was performed using the standard protocol  following bolus administration of intravenous contrast. CONTRAST:  113mL OMNIPAQUE IOHEXOL 300 MG/ML  SOLN COMPARISON:  10/14/2018 CT abdomen/pelvis. FINDINGS: Lower chest: Trace dependent right pleural effusion with mild dependent right lung base atelectasis. Intact  lower sternotomy wires. Mild cardiomegaly. Hepatobiliary: Normal liver size. No liver mass. Cholecystectomy. Bile ducts are within normal post cholecystectomy limits and are stable. CBD diameter 6 mm. Pancreas: Normal, with no mass or duct dilation. Spleen: Normal size. No mass. Adrenals/Urinary Tract: Normal adrenals. Simple 2.1 cm lower left renal cyst. Scattered subcentimeter hypodense bilateral renal cortical lesions are too small to characterize and require no follow-up. Mild bilateral hydroureteronephrosis to the level of the ureterovesical junction bilaterally. Severe masslike irregular wall thickening throughout the entire posterior and superior bladder wall with heterogeneous hyperenhancement, spanning up to 10.1 x 6.8 x 7.7 cm (series 3/image 76), new. Diffuse haziness of the perivesical fat. Stomach/Bowel: Small to moderate hiatal hernia. Otherwise normal nondistended stomach. Normal caliber small bowel with no small bowel wall thickening. Appendix not discretely visualized. No pericecal inflammatory changes. Mild sigmoid diverticulosis. Suggestion of mild wall thickening throughout the mid to distal sigmoid colon with associated haziness of the pericolonic fat. Vascular/Lymphatic: Atherosclerotic nonaneurysmal abdominal aorta. Patent portal, splenic, hepatic and renal veins. No pathologically enlarged lymph nodes in the abdomen or pelvis. Reproductive: Status post hysterectomy, with no abnormal findings at the vaginal cuff. No adnexal mass. Other: No pneumoperitoneum, ascites or focal fluid collection. Musculoskeletal: No aggressive appearing focal osseous lesions. Chronic severe L1 vertebral compression fracture. Marked lumbar spondylosis.  IMPRESSION: 1. Severe masslike irregular wall thickening and hyperenhancement throughout the entire posterior and superior bladder wall, spanning up to 10.1 x 6.8 x 7.7 cm, new, most suspicious for primary bladder malignancy. A severe cystitis is on the differential, although considered less likely. Nonspecific diffuse haziness of the perivesical fat, which could represent secondary cystitis or fat infiltration by tumor. 2. Mild bilateral hydroureteronephrosis to the level of the ureterovesical junction bilaterally, probably due to suspected bladder mass. 3. Trace dependent right pleural effusion. 4. Mild sigmoid diverticulosis. Suggestion of mild wall thickening throughout the mid to distal sigmoid colon with associated haziness of the pericolonic fat. No free air. No abscess. Findings are nonspecific, potentially reactive to the bladder process, with serosal tumor involvement not excluded. 5. No adenopathy or other findings suspicious for metastatic disease in the abdomen or pelvis. 6. Small to moderate hiatal hernia. 7. Mild cardiomegaly. 8. Chronic severe L1 vertebral compression fracture. 9. Aortic Atherosclerosis (ICD10-I70.0). Electronically Signed   By: Ilona Sorrel M.D.   On: 04/11/2020 19:47    I independently reviewed the above imaging studies.  Impression/Assessment:  1. History of erosion of mesh utilized for prolapse repair. This is longstanding and has been associated with frequent urinary tract infections. This has been recently evaluated with cystoscopy at [facility]. Apparently, no urothelial abnormalities were noted. She did, unfortunately, developed sepsis after that requiring hospitalization  2. Bladder abnormality on CT scan. I reviewed images myself as well as prior scan from 2020. Certainly, she does have wall thickening, but with her history, I feel that this is more than likely due to the patient's chronic inflammatory process rather than a  urothelial malignancy, although this is not ruled out. She is not currently experiencing gross hematuria. If this was a significant malignancy, more than likely would have been picked up on the recent cystoscopy in [place]:  1. I discussed my findings with the patient and her daughter, [name] (over the phone) today. At this point I do not think further urologic intervention is necessary, just treat underlying infection  2. I do agree with palliative/hospice care. Not necessarily because of this worry about wall thickening  but because of her overall medical condition  3. I will order urinary cytology. This will have to be taken on a voided specimen  4. If further urologic intervention necessary during her hospitalization please contact me

## 2020-04-13 LAB — RENAL FUNCTION PANEL
Albumin: 2.2 g/dL — ABNORMAL LOW (ref 3.5–5.0)
Anion gap: 10 (ref 5–15)
BUN: 13 mg/dL (ref 8–23)
CO2: 24 mmol/L (ref 22–32)
Calcium: 11.3 mg/dL — ABNORMAL HIGH (ref 8.9–10.3)
Chloride: 106 mmol/L (ref 98–111)
Creatinine, Ser: 0.92 mg/dL (ref 0.44–1.00)
GFR, Estimated: 59 mL/min — ABNORMAL LOW (ref >60)
Glucose, Bld: 86 mg/dL (ref 70–99)
Phosphorus: 2.3 mg/dL — ABNORMAL LOW (ref 2.5–4.6)
Potassium: 3.8 mmol/L (ref 3.5–5.1)
Sodium: 140 mmol/L (ref 135–145)

## 2020-04-13 LAB — CBC
HCT: 25.1 % — ABNORMAL LOW (ref 36.0–46.0)
Hemoglobin: 8 g/dL — ABNORMAL LOW (ref 12.0–15.0)
MCH: 26.5 pg (ref 26.0–34.0)
MCHC: 31.9 g/dL (ref 30.0–36.0)
MCV: 83.1 fL (ref 80.0–100.0)
Platelets: 421 10*3/uL — ABNORMAL HIGH (ref 150–400)
RBC: 3.02 MIL/uL — ABNORMAL LOW (ref 3.87–5.11)
RDW: 14.9 % (ref 11.5–15.5)
WBC: 17.7 10*3/uL — ABNORMAL HIGH (ref 4.0–10.5)
nRBC: 0 % (ref 0.0–0.2)

## 2020-04-13 LAB — GLUCOSE, CAPILLARY
Glucose-Capillary: 82 mg/dL (ref 70–99)
Glucose-Capillary: 130 mg/dL — ABNORMAL HIGH (ref 70–99)

## 2020-04-13 LAB — MAGNESIUM: Magnesium: 2.4 mg/dL (ref 1.7–2.4)

## 2020-04-13 MED ORDER — AMOXICILLIN 500 MG PO CAPS: 500.0000 mg | ORAL_CAPSULE | Freq: Three times a day (TID) | ORAL | 0 refills | Status: AC

## 2020-04-13 MED ORDER — K PHOS MONO-SOD PHOS DI & MONO 155-852-130 MG PO TABS: 500.0000 mg | ORAL_TABLET | Freq: Two times a day (BID) | ORAL | Status: DC

## 2020-04-13 MED ORDER — COVID-19 MRNA VACCINE (PFIZER) 30 MCG/0.3ML IM SUSP
0.3000 mL | Freq: Once | INTRAMUSCULAR | Status: AC
  Administered 2020-04-13: 0.3 mL via INTRAMUSCULAR
  Filled 2020-04-13: qty 0.3

## 2020-04-13 MED ORDER — FERROUS SULFATE 325 (65 FE) MG PO TBEC: 325.0000 mg | DELAYED_RELEASE_TABLET | Freq: Every day | ORAL | 0 refills | Status: AC

## 2020-04-13 MED ORDER — DOCUSATE SODIUM 100 MG PO CAPS: 100.0000 mg | ORAL_CAPSULE | Freq: Two times a day (BID) | ORAL | 0 refills | Status: AC

## 2020-04-13 MED ORDER — ENSURE ENLIVE PO LIQD: 237.0000 mL | Freq: Three times a day (TID) | ORAL | 12 refills | Status: AC

## 2020-04-13 MED ORDER — K PHOS MONO-SOD PHOS DI & MONO 155-852-130 MG PO TABS: 500.0000 mg | ORAL_TABLET | Freq: Two times a day (BID) | ORAL | 0 refills | Status: AC

## 2020-04-13 NOTE — Progress Notes (Signed)
[facility] ([facility]) Community Based Palliative Care       This patient is enrolled in our palliative care services in the community.  [facility] will continue to follow for any discharge planning needs and to coordinate continuation of palliative care.   If you have questions or need assistance, please call [phone] or contact the hospital Liaison listed on AMION.     Thank you for the opportunity to participate in this patient's care.     Chrislyn King, BSN, RN [facility] Hospital Liaison   [phone] 

## 2020-04-13 NOTE — Progress Notes (Signed)
Pt given covid booster shot and observed for 30 minute with no adverse reactions. Pt belongings gathered to be sent with her. PTAR to transport home.

## 2020-04-13 NOTE — TOC Transition Note (Addendum)
Transition of Care [facility]) - CM/SW Discharge Note   Patient Details  Name: Christina Roberts MRN: [id number] Date of Birth: 25-Mar-1927  [facility]) CM/SW Contact:  Carles Collet, RN Phone Number: 04/13/2020, 11:40 AM   Clinical Narrative:   Damaris Schooner w patient's daughter [name] over the phone. Discussed DC today. She has decided against [facility], no DME needs. [facility] notified of DC today. Patient will need PTAR. Demographics verified. [name] requested PTAR for no sooner than 1pm. Requesting COVID booster, attending notified.   Spoke w patient's nurse, she will administer vaccine, and patient will be ready by 3pm. Ptar called for that time, daughter updated.    Final next level of care: [place] Barriers to Discharge: No Barriers Identified   Patient Goals and CMS Choice Patient states their goals for this hospitalization and ongoing recovery are:: to return home CMS Medicare.gov Compare Post Acute Care list provided to:: Patient Choice offered to / list presented to : Adult Children  Discharge Placement                       Discharge Plan and Services                          HH Arranged: PT,OT,RN [place] Agency: [place] ([facility]) Date [place]: 04/13/20 Time [place]: 7253 Representative spoke with at [place]: [place] ([place]) Interventions     Readmission Risk Interventions No flowsheet data found.

## 2020-04-13 NOTE — Discharge Instructions (Signed)

## 2020-04-13 NOTE — Progress Notes (Addendum)
   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 Admitted for recurrent acute lower UTI/Enterococcus UTI, complicated UTI with acute infectious encephalopathy. Persistent hypercalcemia, unclear etiology, ongoing work-up.Overall failure to thrive and has been progressively declining over some time. Discussed in detail with daughter on 12/17 and requested palliative care consultation in the hospital for goals of care.  Today's Discussion (04/13/2020): Chart reviewed. I met with Christina Roberts at bedside this morning - she was shared with me that she was both cold and thirsty. Extra blankets provided. Able to tolerate drinking with a straw a small bottle of water. Appears more clear minded this morning in regard to making needs known.   Plan for patient to transition home with [facility] and Palliative support.  Called patients daughter this morning though reach VM - will try again later to clarify if there are any additional questions for the PM team.  Questions and concerns addressed   Objective Assessment: Vital Signs Vitals:   04/12/20 1923 04/13/20 0238  BP: (!) 145/52 (!) 178/65  Pulse: 81 82  Resp: 17 16  Temp: 98.9 F (37.2 C) 98.4 F (36.9 C)  SpO2: 96% 96%    Intake/Output Summary (Last 24 hours) at 04/13/2020 1111 Last data filed at 04/13/2020  1100 Gross per 24 hour  Intake 979.57 ml  Output 250 ml  Net 729.57 ml   Last Weight  Most recent update: 04/05/2020  9:48 PM   Weight  59 kg (130 lb)           Gen:  Elderly F in NAD HEENT: Dry mucous membranes CV: Regular rate and rhythm  PULM: clear to auscultation bilaterally  ABD: soft/nontender  EXT: No edema  Neuro: Alert to self  SUMMARY OF RECOMMENDATIONS DNAR/DNI  DNR and MOST in Vynca  FTT - Nutrition consult, 1:1 feeding, consider swallow eval. if no improvement  Delirium precautions w/ frequent reorientation  Mobility would benefit from being OOB daily at the least  Back Pain - tylenol atc  Already a part of [facility] palliative care  Incremental support at this juncture given clear goals  Time Spent: 15 Greater than 50% of the time was spent in counseling and coordination of care  [place] Team Team Cell Phone: [phone] Please utilize secure chat with additional questions, if there is no response within 30 minutes please call the above phone number  Palliative Medicine Team providers are available by phone from 7am to 7pm daily and can be reached through the team cell phone.  Should this patient require assistance outside of these hours, please call the patient's attending physician.

## 2020-04-13 NOTE — Discharge Summary (Addendum)
Physician Discharge Summary  Sonoma Firkus DUK:[id number] DOB: August 04, 1926  PCP: Lyman Bishop, DO  Admitted from: Home Discharged to: Home with Palliative Care follow-up in the community (as prior to admission).  Family declined SNF.  Admit date: 04/05/2020 Discharge date: 04/13/2020  Recommendations for Outpatient Follow-up:    Follow-up Information    Lyman Bishop, DO. Schedule an appointment as soon as possible for a visit in 1 week(s).   Specialty: Family Medicine Why: To be seen with repeat labs (CBC, CMP, Mg & Phos).  Follow outstanding labs sent from the hospital (PTHrP, SPEP and kappa and lambda light chains) recommend continued palliative care follow-up in the community. Contact information: [place] [phone]        Marti Sleigh, MD Follow up on 04/23/2020.   Specialties: Gynecology, Urology Why: Keep the prior appointment. Contact information: [place] [address] [phone]                Home Health:  Home Health Orders (From admission, onward)    Start     Ordered   04/13/20 [place]  At discharge       Question Answer Comment  To provide the following care/treatments PT   To provide the following care/treatments OT   To provide the following care/treatments RN      04/13/20 1109           Equipment/Devices: None.  As per [facility] team, patient has all DME's at home or were declined by family.    Discharge Condition: Improved and stable.  Overall long-term prognosis guarded and at high risk for decline and even death.  Daughter aware from multiple discussions.   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  Admitted for recurrent acute lower UTI/Enterococcus UTI, complicated UTI with acute infectious encephalopathy.  Persistent hypercalcemia, unclear etiology.  Overall failure to thrive and has been progressively declining over some time.  Discussed in detail with daughter on 12/17 and requested palliative care consultation in the hospital for goals of care.  Daughter now wishes to take patient home with palliative care follow-up in the community and has repeatedly declined SNF.   Assessment & Plan:   Acute complicated Enterococcus faecalis lower UTI, recurrent UTI  Recurrent UTIs related to erosion of bladder suspension mesh into  the bladder wall.  As per daughter, has been having recurrent UTIs for the last 10 years.  Urine culture showed Enterococcus, only 10K colonies, however she was partially treated-was on 10-day course of Keflex prior to admission.  IV ceftriaxone 12/11-12/13, IV ampicillin 12/14 >>  Reviewed Dr. Alta Corning, Urogynecology at [facility], office notes from last visit on 12/1 in care everywhere: Chronic bladder mesh erosion with recent increased bleeding secondary to severe bladder inflammation  from the mesh erosion, could not rule out concomitant bladder tumor or other lesion within the bladder on recent cystoscopy, urosepsis complicating chronic infection, recent decline in mental status.  After discussing with family, conservative and expectant management was decided.  Remains at risk for recurrent UTIs.  Next follow-up on 12/29.  Therapies have evaluated and recommend SNF.  Daughter declines and wishes to take home with her.  She is at home 24/7 and has taken time off from work and may not even return to work.  She is also coordinating with an agency to be able to provide 12-hour assistance in the daytime, previously had 8-hour assistance only.  She and her sister plan to meet with that company tomorrow  As per chart review, patient followed by a [facility]-community-based palliative care.  I discussed with ID MD on call 12/17.  Given ongoing leukocytosis, worse than baseline, chronic foreign body in bladder, recommended getting CT abdomen with contrast to evaluate for infectious source or complications i.e. abscess etc.  ID also recommended continuing IV ampicillin while inpatient and could consider amoxicillin/Augmentin suppressive treatment at discharge.  CT abdomen results reviewed.  Reported as bladder tumor/malignancy.  However had Urology consult on the patient who feel that this is not malignancy but rather inflammatory issue although cannot definitively rule out malignancy.  Leukocytosis better.  No gross hematuria for several days taking care of her.  Afebrile.  She has completed 5 days of IV ampicillin.  Will discharge on a 7-day course of oral amoxicillin.  PCP at follow-up or her urologist can determine if she needs to be on any suppressive antibiotics.  Hypercalcemia  Unclear etiology.  Could also be contributing to her mental status changes.  Corrected serum calcium between 12.3-12.7 (for albumin of 2.3)  PTH intact: 5 (low).  Vitamin D, 25-hydroxy: 88.07  (normal)  Vitamin D, 1, 25, hydroxy: Normal and PTH RP pending.  Trial of nasal calcitonin.    I discussed with Nephrology MD on call on 12/16.  Mild hypercalcemia is new and worse than her baseline.  Concern for underlying malignancy.  He recommended checking SPEP, free light chains, IV Lasix 20 mg x 1, stopping IV fluids.  Recent chest x-ray 12/12 without acute findings.  Updated patient's daughter regarding my concern about underlying malignancy.  No change in hypercalcemia despite a dose of IV Lasix.  Unable to use bisphosphonate due to listed allergy of swelling of lips/nose with alendronate.  As per my discussion with daughter, patient's lips "blew up like a duck".  As discussed with nephrology again on 12/18, calcitonin nasal while hospitalized only, Lasix 20 mg orally daily at discharge and no further evaluation or management.  Her hypercalcemia was treated with aggressive IV fluid hydration, a dose of IV Lasix and nasal calcitonin without significant improvement.  Since patient was on Lasix 40 mg daily at discharge, continue same.  Discontinued vitamin D supplements and multivitamins although may not be huge contributors to this hypercalcemia.  As discussed with nephrology, steroids not likely to help since the  process does not seem granulomatous in nature and also this will be short-term measure.  Follow-up CMP as outpatient and other outstanding labs (PTH RP, SPEP, kappa and lambda light chains).  There may not be much else to offer.  Persistent leukocytosis  Management as noted above.  WBC count slightly better today.  Hypokalemia/hypomagnesemia:  Replaced.  Hypophosphatemia  Replace.  Follow-up as outpatient.  Acute encephalopathy, infectious etiology  Due to UTI, dehydration and hypercalcemia complicating suspected underlying dementia.  As per sister, no formal diagnosis of dementia but may have some degree of age-related cognitive impairment or dementia now  complicated by acute infectious etiology.  Delirium precautions and minimize opioids/sedatives as much as possible.  As per my discussion with patient's daughter on 12/18, seems to be doing better, ate much better and mental status more clear.  Type II DM, controlled  A1c 5.5 on 04/06/2020 indicating excellent control.  Resume home Metformin at discharge  Essential hypertension  Controlled on Toprol-XL, continue.  Mildly uncontrolled at times.  Resumed home dose of oral Lasix at discharge.  Discontinued prior lisinopril due to increased risk of AKI from inconsistent oral intake, hypercalcemia and possible dehydration.  Hyperlipidemia:  Continue statins.  Severe iron deficiency anemia:  May be related to chronic hematuria related to erosion of bladder suspension mesh into bladder wall.  Anemia panel 12/12: Iron 20, TIBC 241, saturation ratio 8, ferritin 56, folate 68.6, B12: 359.  S/p dose of IV iron on 12/14  Hemoglobin stable in the low 8 g range for last few days.  Started low-dose iron supplements at discharge along with Colace to avoid constipation.  Follow CBCs as outpatient.  Adult failure to thrive:  Multifactorial due to very advanced age, multiple severe significant and some irreversible comorbidities, frailty, acute/subacute AMS complicating possible underlying dementia. Ongoing poor oral intake.  Followed by outpatient palliative care.  After discussing with daughter, palliative care consulted and their input appreciated.  As discussed with daughter, she does not want patient to go to SNF, prefers to go home with palliative (not hospice) since she will get some home health therapies and believes that patient may make some improvement.  She is open to transitioning to hospice from home if she does not improve.  She and her sister are working with the home health company to find extended coverage.  She requests possible discharge tomorrow or the day  after.  Aortic atherosclerosis:  Noted on CT abdomen.  Continue statins.  Chronic severe L1 vertebral compression fracture  Noted on CT abdomen.  Supportive treatment.  Body mass index is 20.98 kg/m.  [place] booster vaccine  Daughter requested that patient get a booster shot before discharge.  She reportedly got her second dose of Pfizer vaccine in March 2021.  Consulted vaccine team for administration.      Consultants:   Palliative care team Urology  Procedures:   None   Discharge Instructions  Discharge Instructions    Call MD for:   Complete by: As directed    Recurrent worsening mental status changes or confusion.   Call MD for:  difficulty breathing, headache or visual disturbances   Complete by: As directed    Call MD for:  extreme fatigue   Complete by: As directed    Call MD for:  persistant dizziness or light-headedness   Complete by: As directed    Call MD for:  persistant nausea and vomiting   Complete by: As directed    Call MD for:  severe uncontrolled pain   Complete by: As directed    Call MD for:  temperature >100.4   Complete by: As directed    Diet - low sodium heart healthy   Complete by: As directed    Diet Carb Modified   Complete by: As directed    Increase activity slowly   Complete by: As directed        Medication List    STOP taking these medications   cephALEXin 500 MG capsule Commonly known as: KEFLEX   ONE DAILY MULTIVITAMIN WOMEN PO   ramipril 10 MG capsule Commonly known as: ALTACE   Vitamin D3 50 MCG (2000 UT) Tabs     TAKE these medications   amoxicillin 500 MG capsule Commonly known as: AMOXIL Take 1 capsule (500 mg total) by mouth 3 (three) times daily for 7 days. Through 04/12/2020   ASPERCREME EX Apply 1 application topically daily.   aspirin 81 MG chewable tablet Chew 81 mg by mouth daily.   atorvastatin 20 MG tablet Commonly known as: LIPITOR Take 20 mg by mouth daily.    docusate sodium 100 MG capsule Commonly known as: Colace Take 1 capsule (100 mg total) by mouth 2 (two) times daily.   feeding supplement Liqd Take 237 mLs by mouth 3 (three) times daily between meals.   ferrous sulfate 325 (65 FE) MG EC tablet Take 1 tablet (325 mg total) by mouth daily with breakfast.   furosemide 40 MG tablet Commonly known as: LASIX Take 40 mg by mouth daily.   HYDROcodone-acetaminophen 7.5-325 MG tablet Commonly known as: NORCO Take 0.5 tablets by mouth every 6 (six) hours as needed for pain or severe pain.   liver oil-zinc oxide 40 % ointment Commonly known as: DESITIN Apply 1 application topically as needed (diaper rash).   melatonin 5 MG Tabs Take 5 mg by mouth at bedtime as needed (sleep).   metFORMIN 500 MG tablet Commonly known as: GLUCOPHAGE Take 500 mg by mouth at bedtime.   metoprolol succinate 25 MG 24 hr tablet Commonly known as: TOPROL-XL Take 12.5 mg by mouth daily.   OVER THE COUNTER MEDICATION Apply 1 application topically daily as needed (pain). Hemp vana   PARoxetine 20 MG tablet Commonly known as: PAXIL Take 20 mg by mouth daily.   phosphorus 155-852-130 MG tablet Commonly known as: K PHOS NEUTRAL Take 2 tablets (500 mg total) by mouth 2 (two) times daily for 2 days.   PROBIOTIC PRODUCT PO Take 1 tablet by mouth daily.      Allergies  Allergen Reactions  . Flomax [Tamsulosin Hcl] Swelling  . Fosamax [Alendronate Sodium] Swelling    Swelling of lips and nose  . Nsaids Other (See Comments)      Procedures/Studies: CT ABDOMEN PELVIS W CONTRAST  Result Date: 04/11/2020 CLINICAL DATA:  Inpatient. Recurrent UTI. Chronic bladder mesh erosion. Admitted with altered mental status. EXAM: CT ABDOMEN AND PELVIS WITH CONTRAST TECHNIQUE: Multidetector CT imaging of the abdomen and pelvis was performed using the standard protocol following bolus administration of intravenous contrast. CONTRAST:  120mL OMNIPAQUE IOHEXOL 300  MG/ML  SOLN COMPARISON:  10/14/2018 CT abdomen/pelvis. FINDINGS: Lower chest: Trace dependent right pleural effusion with mild dependent right lung base atelectasis. Intact lower sternotomy wires. Mild cardiomegaly. Hepatobiliary: Normal liver size. No liver mass. Cholecystectomy. Bile ducts are within normal post cholecystectomy limits and are stable. CBD diameter 6 mm. Pancreas: Normal, with no mass or duct dilation. Spleen: Normal size. No mass. Adrenals/Urinary Tract: Normal  adrenals. Simple 2.1 cm lower left renal cyst. Scattered subcentimeter hypodense bilateral renal cortical lesions are too small to characterize and require no follow-up. Mild bilateral hydroureteronephrosis to the level of the ureterovesical junction bilaterally. Severe masslike irregular wall thickening throughout the entire posterior and superior bladder wall with heterogeneous hyperenhancement, spanning up to 10.1 x 6.8 x 7.7 cm (series 3/image 76), new. Diffuse haziness of the perivesical fat. Stomach/Bowel: Small to moderate hiatal hernia. Otherwise normal nondistended stomach. Normal caliber small bowel with no small bowel wall thickening. Appendix not discretely visualized. No pericecal inflammatory changes. Mild sigmoid diverticulosis. Suggestion of mild wall thickening throughout the mid to distal sigmoid colon with associated haziness of the pericolonic fat. Vascular/Lymphatic: Atherosclerotic nonaneurysmal abdominal aorta. Patent portal, splenic, hepatic and renal veins. No pathologically enlarged lymph nodes in the abdomen or pelvis. Reproductive: Status post hysterectomy, with no abnormal findings at the vaginal cuff. No adnexal mass. Other: No pneumoperitoneum, ascites or focal fluid collection. Musculoskeletal: No aggressive appearing focal osseous lesions. Chronic severe L1 vertebral compression fracture. Marked lumbar spondylosis. IMPRESSION: 1. Severe masslike irregular wall thickening and hyperenhancement throughout the  entire posterior and superior bladder wall, spanning up to 10.1 x 6.8 x 7.7 cm, new, most suspicious for primary bladder malignancy. A severe cystitis is on the differential, although considered less likely. Nonspecific diffuse haziness of the perivesical fat, which could represent secondary cystitis or fat infiltration by tumor. 2. Mild bilateral hydroureteronephrosis to the level of the ureterovesical junction bilaterally, probably due to suspected bladder mass. 3. Trace dependent right pleural effusion. 4. Mild sigmoid diverticulosis. Suggestion of mild wall thickening throughout the mid to distal sigmoid colon with associated haziness of the pericolonic fat. No free air. No abscess. Findings are nonspecific, potentially reactive to the bladder process, with serosal tumor involvement not excluded. 5. No adenopathy or other findings suspicious for metastatic disease in the abdomen or pelvis. 6. Small to moderate hiatal hernia. 7. Mild cardiomegaly. 8. Chronic severe L1 vertebral compression fracture. 9. Aortic Atherosclerosis (ICD10-I70.0). Electronically Signed   By: Ilona Sorrel M.D.   On: 04/11/2020 19:47   DG Chest Port 1 View  Result Date: 04/06/2020 CLINICAL DATA:  Hallucinations EXAM: PORTABLE CHEST 1 VIEW COMPARISON:  October 14, 2018 FINDINGS: The heart size and mediastinal contours are within normal limits. Both lungs are clear. The visualized skeletal structures are unremarkable. Aortic calcifications are again noted. IMPRESSION: No active disease. Electronically Signed   By: Constance Holster M.D.   On: 04/06/2020 01:14      Subjective: Patient alert and oriented only to self this morning. Slightly confused.  Based on my visit for the last couple of days, this is not unusual for her mental status in the early mornings which seems to improve as the day goes by.  As per RN, no acute issues reported.  RN confirmed that patient did much better yesterday afternoon with improved oral intake and  alertness.  Discharge Exam:  Vitals:   04/12/20 0834 04/12/20 1532 04/12/20 1923 04/13/20 0238  BP: 127/73 140/65 (!) 145/52 (!) 178/65  Pulse: (!) 101 90 81 82  Resp: 18 18 17 16   Temp: 98.6 F (37 C) 97.6 F (36.4 C) 98.9 F (37.2 C) 98.4 F (36.9 C)  TempSrc: Oral Oral Oral Oral  SpO2: 97% 100% 96% 96%  Weight:      Height:        General exam: Pleasant elderly female, moderately built and thinly nourished, chronically ill looking, lying comfortably propped up  in bed without distress.  Oral mucosa moist. Respiratory system: Clear to auscultation.  No increased work of breathing. Cardiovascular system: S1 & S2 heard, RRR. No JVD, murmurs, rubs, gallops or clicks. No pedal edema. Gastrointestinal system: Abdomen is nondistended, soft and nontender. No organomegaly or masses felt. Normal bowel sounds heard. Central nervous system: Alert and oriented only to self.  No focal neurological deficits. Extremities: Symmetric 5 x 5 power. Skin: No rashes, lesions or ulcers Psychiatry: Judgement and insight impaired. Mood & affect flat.     The results of significant diagnostics from this hospitalization (including imaging, microbiology, ancillary and laboratory) are listed below for reference.     Microbiology: Recent Results (from the past 240 hour(s))  Urine culture     Status: Abnormal   Collection Time: 04/06/20  1:00 AM   Specimen: Urine, Random  Result Value Ref Range Status   Specimen Description URINE, RANDOM  Final   Special Requests   Final    NONE Performed at [facility], [address] [place], [place] [address]    Culture 10,000 COLONIES/mL ENTEROCOCCUS FAECALIS (A)  Final   Report Status 04/08/2020 FINAL  Final   Organism ID, Bacteria ENTEROCOCCUS FAECALIS (A)  Final      Susceptibility   Enterococcus faecalis - MIC*    AMPICILLIN <=2 SENSITIVE Sensitive     NITROFURANTOIN <=16 SENSITIVE Sensitive     VANCOMYCIN 1 SENSITIVE Sensitive     * 10,000  COLONIES/mL ENTEROCOCCUS FAECALIS  Resp Panel by RT-PCR (Flu A&B, Covid) Nasopharyngeal Swab     Status: None   Collection Time: 04/06/20  1:03 AM   Specimen: Nasopharyngeal Swab; Nasopharyngeal(NP) swabs in vial transport medium  Result Value Ref Range Status   SARS Coronavirus 2 by RT PCR NEGATIVE NEGATIVE Final    Comment: (NOTE) SARS-CoV-2 target nucleic acids are NOT DETECTED.  The SARS-CoV-2 RNA is generally detectable in upper respiratory specimens during the acute phase of infection. The lowest concentration of SARS-CoV-2 viral copies this assay can detect is 138 copies/mL. A negative result does not preclude SARS-Cov-2 infection and should not be used as the sole basis for treatment or other patient management decisions. A negative result may occur with  improper specimen collection/handling, submission of specimen other than nasopharyngeal swab, presence of viral mutation(s) within the areas targeted by this assay, and inadequate number of viral copies(<138 copies/mL). A negative result must be combined with clinical observations, patient history, and epidemiological information. The expected result is Negative.  Fact Sheet for Patients:  EntrepreneurPulse.com.au  Fact Sheet for Healthcare Providers:  IncredibleEmployment.be  This test is no t yet approved or cleared by the [place] FDA and  has been authorized for detection and/or diagnosis of SARS-CoV-2 by FDA under an Emergency Use Authorization (EUA). This EUA will remain  in effect (meaning this test can be used) for the duration of the COVID-19 declaration under Section 564(b)(1) of the Act, 21 U.S.C.section 360bbb-3(b)(1), unless the authorization is terminated  or revoked sooner.       Influenza A by PCR NEGATIVE NEGATIVE Final   Influenza B by PCR NEGATIVE NEGATIVE Final    Comment: (NOTE) The Xpert Xpress SARS-CoV-2/FLU/RSV plus assay is intended as an aid in the  diagnosis of influenza from Nasopharyngeal swab specimens and should not be used as a sole basis for treatment. Nasal washings and aspirates are unacceptable for Xpert Xpress SARS-CoV-2/FLU/RSV testing.  Fact Sheet for Patients: EntrepreneurPulse.com.au  Fact Sheet for Healthcare Providers: IncredibleEmployment.be  This test is not yet approved or cleared by the [place] and has been authorized for detection and/or diagnosis of SARS-CoV-2 by FDA under an Emergency Use Authorization (EUA). This EUA will remain in effect (meaning this test can be used) for the duration of the COVID-19 declaration under Section 564(b)(1) of the Act, 21 U.S.C. section 360bbb-3(b)(1), unless the authorization is terminated or revoked.  Performed at [facility], [place] [address] [place], [place] [address]      Labs: CBC: Recent Labs  Lab 04/09/20 0357 04/10/20 0412 04/11/20 0055 04/12/20 0106 04/13/20 0155  WBC 18.9* 17.5* 20.4* 20.4* 17.7*  HGB 9.1* 8.5* 7.9* 8.0* 8.0*  HCT 29.7* 27.0* 24.9* 25.7* 25.1*  MCV 82.7 83.1 82.2 82.1 83.1  PLT 476* 397 462* 460* 421*    Basic Metabolic Panel: Recent Labs  Lab 04/09/20 0357 04/09/20 1239 04/10/20 0412 04/11/20 0055 04/12/20 0106 04/13/20 0155 04/13/20 0233  NA 138  --  142 139 140 140  --   K 3.6  --  3.3* 3.5 3.0* 3.8  --   CL 101  --  107 106 104 106  --   CO2 25  --  23 25 25 24   --   GLUCOSE 83  --  89 107* 71 86  --   BUN 11  --  11 10 10 13   --   CREATININE 1.05*  --  0.96 0.89 0.85 0.92  --   CALCIUM 11.6* 11.3* 11.3* 11.4* 11.2* 11.3*  --   MG  --   --   --   --  1.5*  --  2.4  PHOS  --   --   --   --  2.2* 2.3*  --     Liver Function Tests: Recent Labs  Lab 04/09/20 1239 04/10/20 0412 04/11/20 0055 04/12/20 0106 04/13/20 0155  AST  --  19 18  --   --   ALT  --  13 14  --   --   ALKPHOS  --  72 74  --   --   BILITOT  --  0.7 1.0  --   --   PROT  --  5.7* 5.8*  --    --   ALBUMIN 2.3* 2.3* 2.3* 2.2* 2.2*    CBG: Recent Labs  Lab 04/12/20 1148 04/12/20 1615 04/12/20 2114 04/13/20 0618 04/13/20 1141  GLUCAP 112* 116* 106* 82 130*    Urinalysis    Component Value Date/Time   COLORURINE AMBER (A) 04/06/2020 0100   APPEARANCEUR TURBID (A) 04/06/2020 0100   LABSPEC 1.012 04/06/2020 0100   PHURINE 6.0 04/06/2020 0100   GLUCOSEU NEGATIVE 04/06/2020 0100   HGBUR MODERATE (A) 04/06/2020 0100   BILIRUBINUR NEGATIVE 04/06/2020 0100   KETONESUR NEGATIVE 04/06/2020 0100   PROTEINUR 100 (A) 04/06/2020 0100   NITRITE NEGATIVE 04/06/2020 0100   LEUKOCYTESUR LARGE (A) 04/06/2020 0100   I unsuccessfully attempted to reach patient's daughter via phone today.  However I had been regularly updating her over the course of patient's hospitalization and the last time was yesterday afternoon.  No change in plan since then.   Time coordinating discharge: 45 minutes  SIGNED:  Vernell Leep, MD, [place], [facility]. [facility]  To contact the attending provider between 7A-7P or the covering provider during after hours 7P-7A, please log into the web site [email or web address] and access using universal [place] password for that web site. If you do not have the password, please call  the hospital operator.

## 2020-04-14 LAB — PROTEIN ELECTROPHORESIS, SERUM
A/G Ratio: 0.7 (ref 0.7–1.7)
Albumin ELP: 2.4 g/dL — ABNORMAL LOW (ref 2.9–4.4)
Alpha-1-Globulin: 0.3 g/dL (ref 0.0–0.4)
Alpha-2-Globulin: 0.3 g/dL — ABNORMAL LOW (ref 0.4–1.0)
Beta Globulin: 1.4 g/dL — ABNORMAL HIGH (ref 0.7–1.3)
Gamma Globulin: 1.4 g/dL (ref 0.4–1.8)
Globulin, Total: 3.4 g/dL (ref 2.2–3.9)
M-Spike, %: 0.5 g/dL — ABNORMAL HIGH
Total Protein ELP: 5.8 g/dL — ABNORMAL LOW (ref 6.0–8.5)

## 2020-04-14 LAB — KAPPA/LAMBDA LIGHT CHAINS
Kappa free light chain: 92.7 mg/L — ABNORMAL HIGH (ref 3.3–19.4)
Kappa, lambda light chain ratio: 2.32 — ABNORMAL HIGH (ref 0.26–1.65)
Lambda free light chains: 39.9 mg/L — ABNORMAL HIGH (ref 5.7–26.3)

## 2020-04-15 LAB — CYTOLOGY - NON PAP

## 2020-04-17 LAB — PTH-RELATED PEPTIDE: PTH-related peptide: <2 pmol/L

## 2020-04-18 ENCOUNTER — Other Ambulatory Visit: Payer: Medicare Other | Admitting: Internal Medicine

## 2020-04-18 DIAGNOSIS — Z515 Encounter for palliative care: Secondary | ICD-10-CM

## 2020-04-18 DIAGNOSIS — Z7189 Other specified counseling: Secondary | ICD-10-CM

## 2020-04-18 DIAGNOSIS — R319 Hematuria, unspecified: Secondary | ICD-10-CM

## 2020-04-18 DIAGNOSIS — A419 Sepsis, unspecified organism: Secondary | ICD-10-CM

## 2020-04-19 NOTE — Progress Notes (Signed)
[facility] Consult Note Telephone: [phone]  Fax: [phone]  PATIENT NAME: Christina Roberts DOB: 1926/10/26 MRN: [id number]  PRIMARY CARE PROVIDER:   Lyman Bishop, DO  REFERRING PROVIDER:  Lyman Bishop, DO [place],  [place] [address]  RESPONSIBLE PARTYFonda Kinder Daughter   [phone]     RECOMMENDATIONS and PLAN:  Palliative care encounter  Z51.5  1.  Advance care planning: DNAR and MOST forms previously completed.  Per request of daughters, goals have changed to comfort measures without return to hospital or escalation of treatments.  Daughters report that they are interested in transition to Hospice care if pt is eligible related to additional decline of her health.  Conferred with Dr Tomasa Hosteller, hospice provider, who agrees with probable less than 6 month prognosis related to urosepsis and probable bladder cancer. PCP and gyno/urologist were unavailable for telephone communication due to holiday schedule.  Plan on notify these offices on next business day.  [facility] referral center notified and scheduled hospice admission visit on 04/19/2020. Daughter agrees with plan.  2.  Hematuria:  Surgery for removal of bladder mesh was cancelled.  Bladder wall thickening per CT scan and cannot r/o bladder cancer per report. High risk of recurrent UTIs. No escalation of therapy per daughters request.   3.  Urosepsis:  Recurrent and will continue to be at high risk for recurrent UTIs.  Complete current doses of Amoxicillin. No plans for additional treatments in the future. Use of other medications will be reviewed by hospice nurse Transition to comfort care.   4.  Chronic back pain: At baseline. Hydrocodone as prescribed and tolerated.  Consider change to liquid formulation due to dysphagia.  5.  Altered level of consciousness:   Related to urosepsis and apparent nearing end of life. Supportive  care.   I spent 60 minutes providing this consultation,  from 1500 to 1600. More than 50% of the time in this consultation was spent coordinating communication with patient and , daughters.   HISTORY OF PRESENT ILLNESS: Follow-up with Christina Roberts  Daughter reports that patient has had additional decline of health since discharge from hospital on 04/12/20.  Reports decreased level of conciousness with mumbling for speech.  She has been eating only bites of applesauce for crushed medication administration and a few sips of beverages. Increased sleeping, confusion and now non-ambulatory.  She is incontinent of B&B. Daughters have decided to for goe any additional hospitalizations or treatments. She is soon to complete oral antibiotic therapy but continues to decline.  Palliative Care was asked to help address goals of care.   CODE STATUS:  DNAR/DNI, no return to hospital  PPS: 20%  HOSPICE ELIGIBILITY/DIAGNOSIS: YES/ urosepsis, probable bladder cancer  PAST MEDICAL HISTORY:  Past Medical History:  Diagnosis Date  . Closed compression fracture of body of L1 vertebra ([place])   . Diabetes mellitus without complication ([place])   . Diverticular disease   . Hiatal hernia   . Hypertension   . Ventral hernia     SOCIAL HX: Currently living with daughter.  4 children Social History   Tobacco Use  . Smoking status: Never Smoker  . Smokeless tobacco: Never Used  Substance Use Topics  . Alcohol use: Never    ALLERGIES:  Allergies  Allergen Reactions  . Fosamax [Alendronate Sodium] Swelling    Swelling of lips and nose     PERTINENT MEDICATIONS:  Outpatient Encounter  Medications as of 03/04/2020  Medication Sig  . aspirin 81 MG chewable tablet Chew 81 mg by mouth daily.   Marland Kitchen atorvastatin (LIPITOR) 20 MG tablet Take 20 mg by mouth daily.   . Cholecalciferol (VITAMIN D3) 50 MCG (2000 UT) TABS Take 1 tablet by mouth daily.  . diclofenac sodium (VOLTAREN) 1 % GEL Apply 2 g topically 4 (four)  times daily.  . furosemide (LASIX) 40 MG tablet Take 40 mg by mouth daily.   Marland Kitchen gabapentin (NEURONTIN) 100 MG capsule Take 100-300 mg by mouth at bedtime.  Marland Kitchen HYDROcodone-acetaminophen (NORCO/VICODIN) 5-325 MG tablet Take 1 tablet by mouth every 6 (six) hours as needed for pain.  . metFORMIN (GLUCOPHAGE) 500 MG tablet Take 500 mg by mouth at bedtime.   . metoprolol succinate (TOPROL-XL) 25 MG 24 hr tablet Take 12.5 mg by mouth daily.   . Multiple Vitamins-Minerals (ONE DAILY MULTIVITAMIN WOMEN PO) Take 1 tablet by mouth daily.  . naproxen sodium (ALEVE) 220 MG tablet Take 220 mg by mouth as needed (pain).  . pantoprazole (PROTONIX) 40 MG tablet Take 1 tablet (40 mg total) by mouth 2 (two) times daily before a meal. Take 2 tabs daily for 30 days then take 1 tab daily  . PARoxetine (PAXIL) 20 MG tablet Take 20 mg by mouth daily.   Marland Kitchen PROBIOTIC PRODUCT PO Take 1 tablet by mouth daily.  . ramipril (ALTACE) 10 MG capsule Take 10 mg by mouth daily.   Loura Pardon Salicylate (ASPERCREME EX) Apply 1 application topically daily.   No facility-administered encounter medications on file as of 03/04/2020.    PHYSICAL EXAM:   General: Limited exam due to telephonic visit Neurological: Speech is garbled and soft  [name], NP-C

## 2020-04-21 ENCOUNTER — Other Ambulatory Visit: Payer: Self-pay

## 2020-04-26 DEATH — deceased

## 2020-09-06 IMAGING — CT CT ABDOMEN AND PELVIS WITHOUT CONTRAST
2 of 4 series · 16 of 46 positions shown, 18 images · non-contrast
Comparison: None.

CLINICAL DATA: Nausea and vomiting

EXAM:
CT ABDOMEN AND PELVIS WITHOUT CONTRAST
TECHNIQUE: Multidetector CT imaging of the abdomen and pelvis was performed
following the standard protocol without IV contrast.

[Series 3: a/p w/o 5mm · axial · non-contrast · 0.95mm/px · z∈[+695,+1100]mm · 13 of 89 slices shown, 15 images]
[im 4/89  soft-tissue]
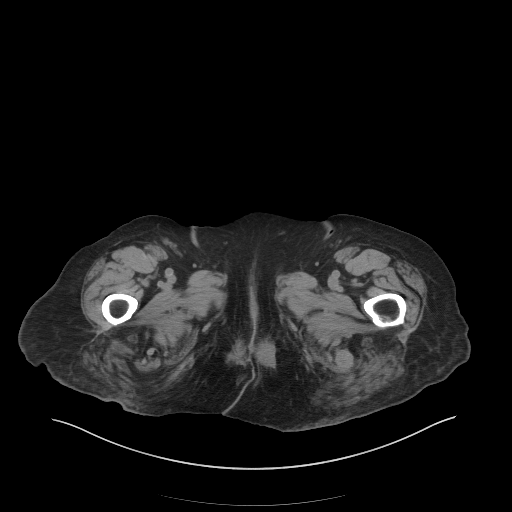
[im 4/89  bone]
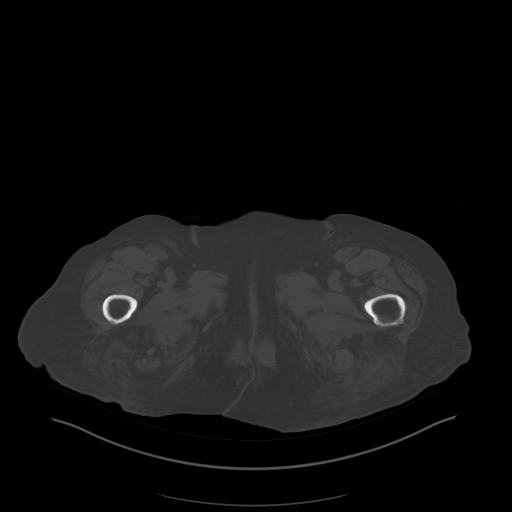
[im 11/89  soft-tissue]
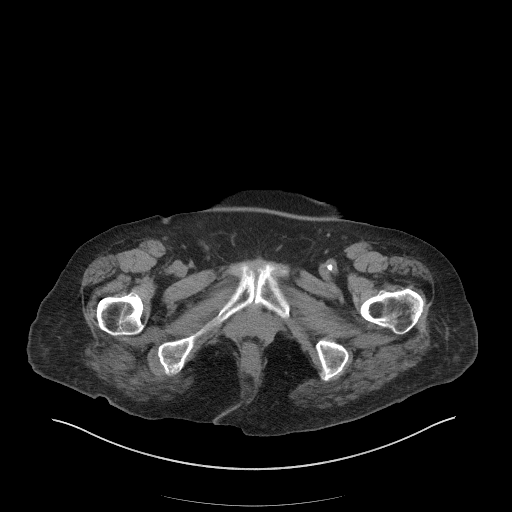
[im 18/89  soft-tissue]
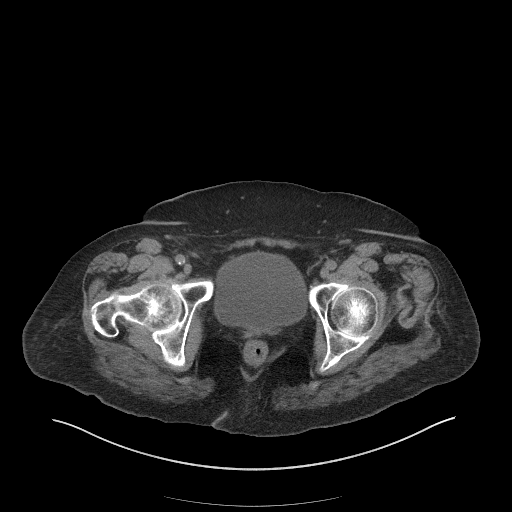
[im 25/89  soft-tissue]
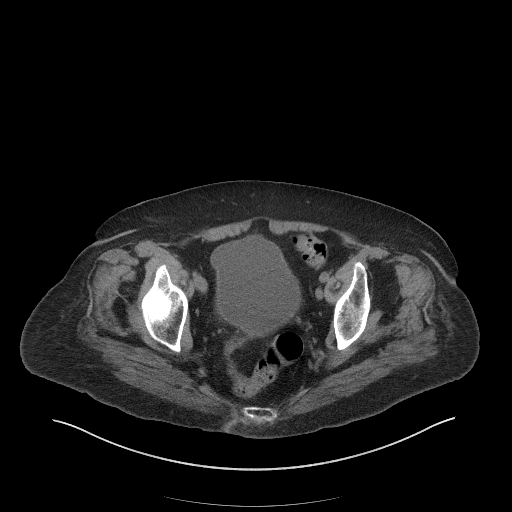
[im 32/89  soft-tissue]
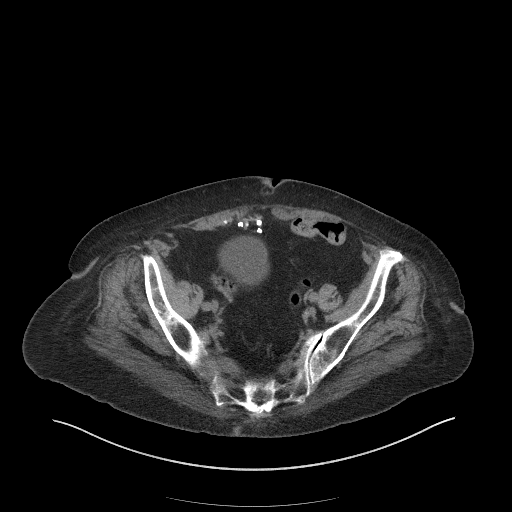
[im 39/89  soft-tissue]
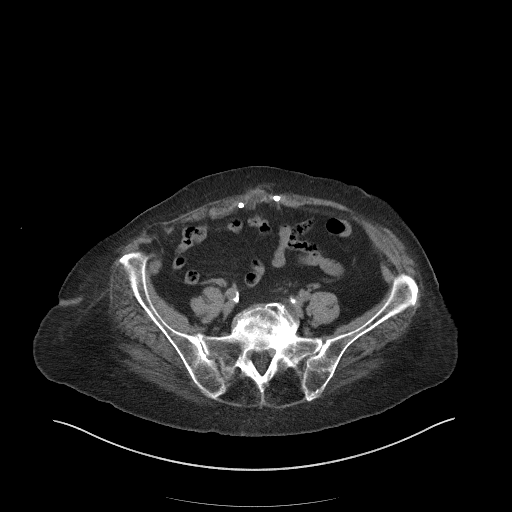
[im 46/89  soft-tissue]
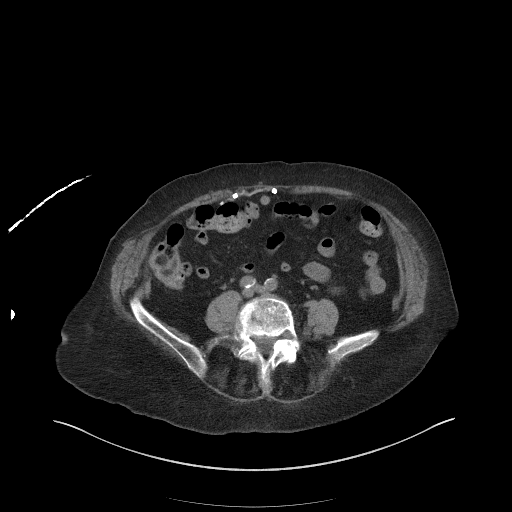
[im 50/89  soft-tissue]
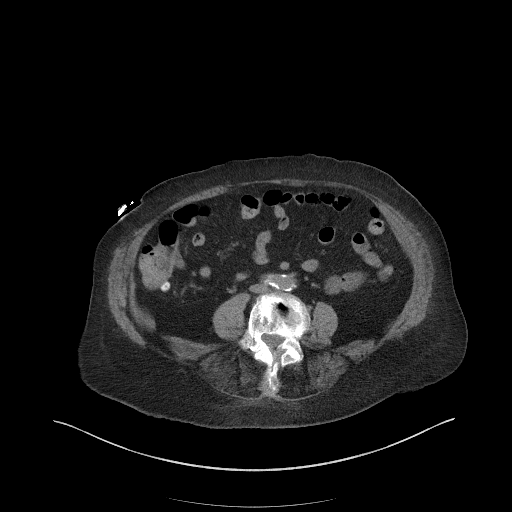
[im 57/89  soft-tissue]
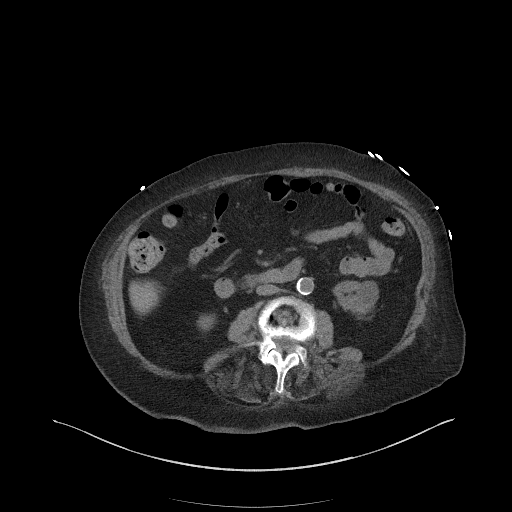
[im 57/89  bone]
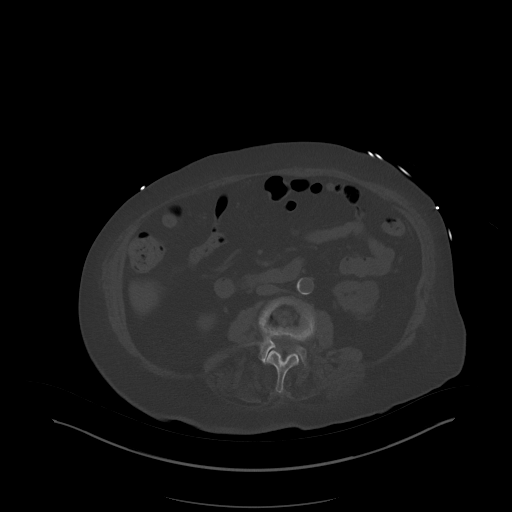
[im 64/89  soft-tissue]
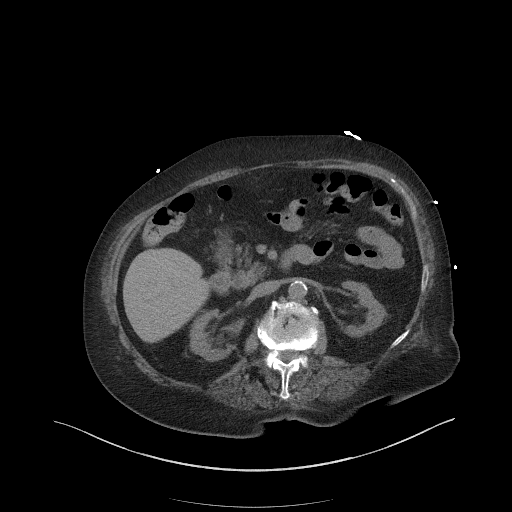
[im 71/89  soft-tissue]
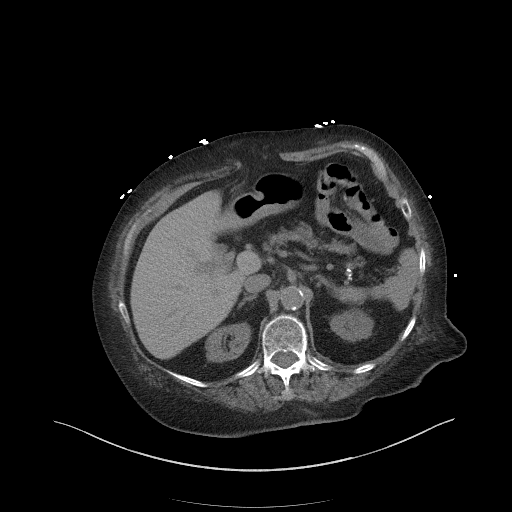
[im 78/89  soft-tissue]
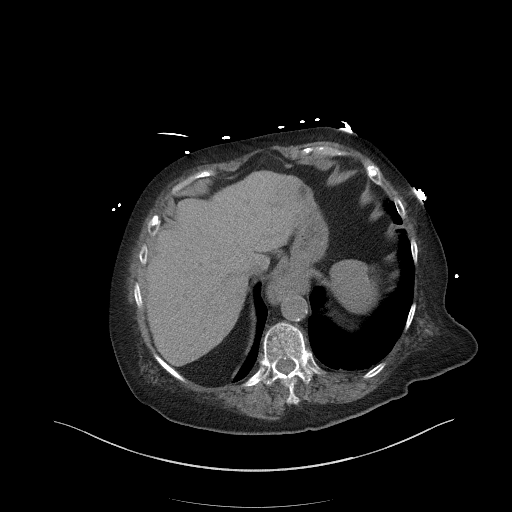
[im 85/89  soft-tissue]
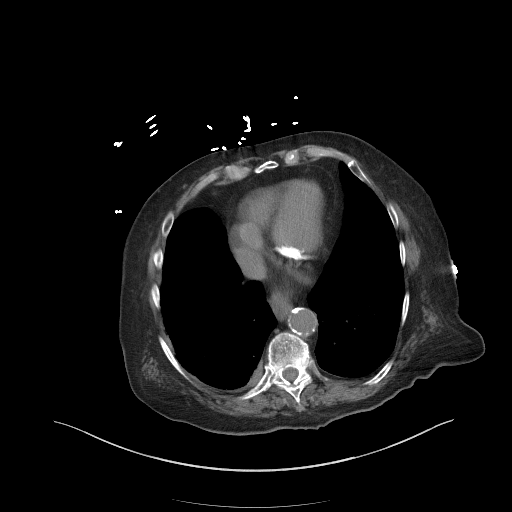

[Series 6: a/p w/o cor · coronal · non-contrast · 0.86mm/px · 3 of 151 slices shown]
[im 51/151  soft-tissue]
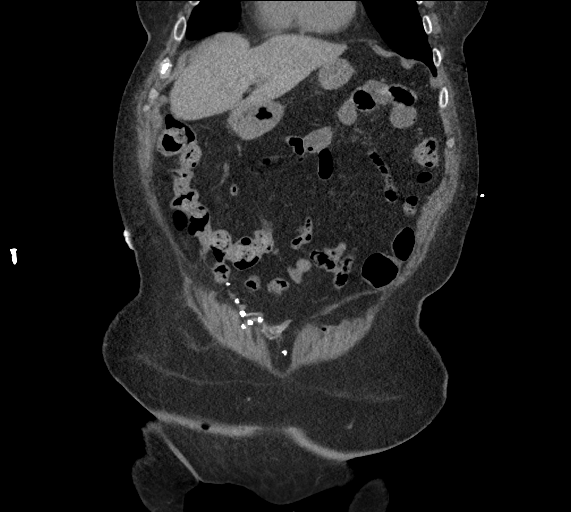
[im 67/151  soft-tissue]
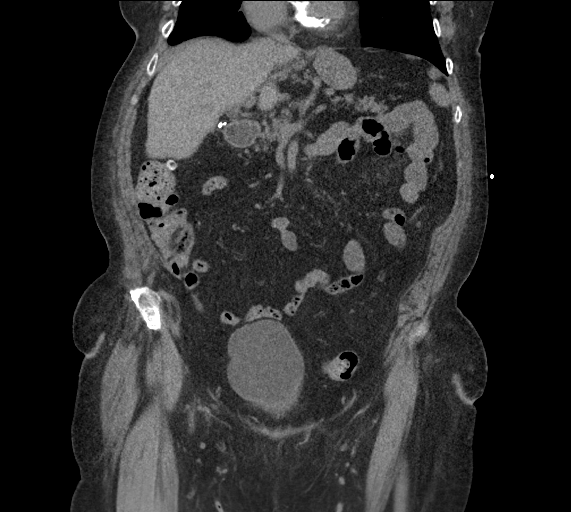
[im 84/151  soft-tissue]
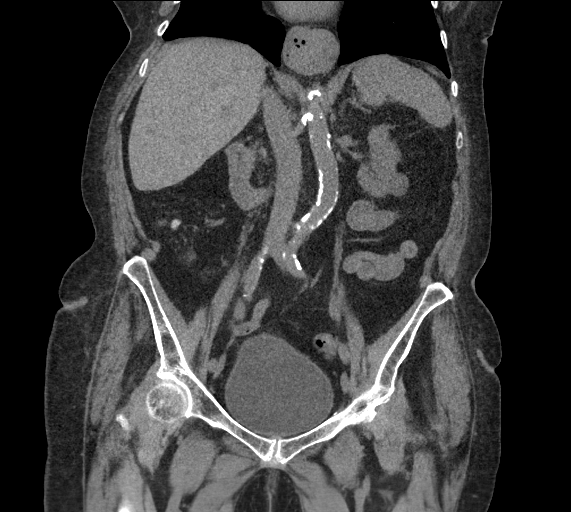

[16 of 46 positions shown; findings below may reference images not displayed]

FINDINGS: Lower chest: Lung bases demonstrate no acute consolidation or
effusion. The heart size is within normal limits. Dense mitral
calcification. Moderate hiatal hernia.

Hepatobiliary: No focal liver abnormality is seen. Status post
cholecystectomy. No biliary dilatation.

Pancreas: Unremarkable. No pancreatic ductal dilatation or
surrounding inflammatory changes.

Spleen: Normal in size without focal abnormality.

Adrenals/Urinary Tract: Adrenal glands are within normal limits.
Mildly atrophic kidneys. No hydronephrosis. Probable cyst lower pole
left kidney. Bladder is unremarkable

Stomach/Bowel: Stomach is nonenlarged. No dilated small bowel. No
colon wall thickening. Diverticular disease of the colon without
acute inflammatory change. Appendix not well seen but no right lower
quadrant inflammation.

Vascular/Lymphatic: Moderate aortic atherosclerosis without
aneurysm. No significantly enlarged lymph nodes

Reproductive: Status post hysterectomy. No adnexal masses.

Other: Negative for free air or free fluid. Previous ventral hernia
repair. Small moderate supraumbilical fat containing ventral hernia.

Musculoskeletal: Chronic compression fracture L1. Degenerative
changes without acute osseous abnormality
IMPRESSION: 1. No CT evidence for acute intracranial abnormality.
2. Moderate hiatal hernia
3. Colon diverticular disease without acute inflammatory process
4. Fat containing supraumbilical ventral hernia
5. Chronic compression fracture L1

## 2020-09-06 IMAGING — DX PORTABLE CHEST - 1 VIEW
1 series · 1 of 1 positions shown · non-contrast
Comparison: None.

CLINICAL DATA: Chest pain

EXAM:
PORTABLE CHEST 1 VIEW

[chest ap]
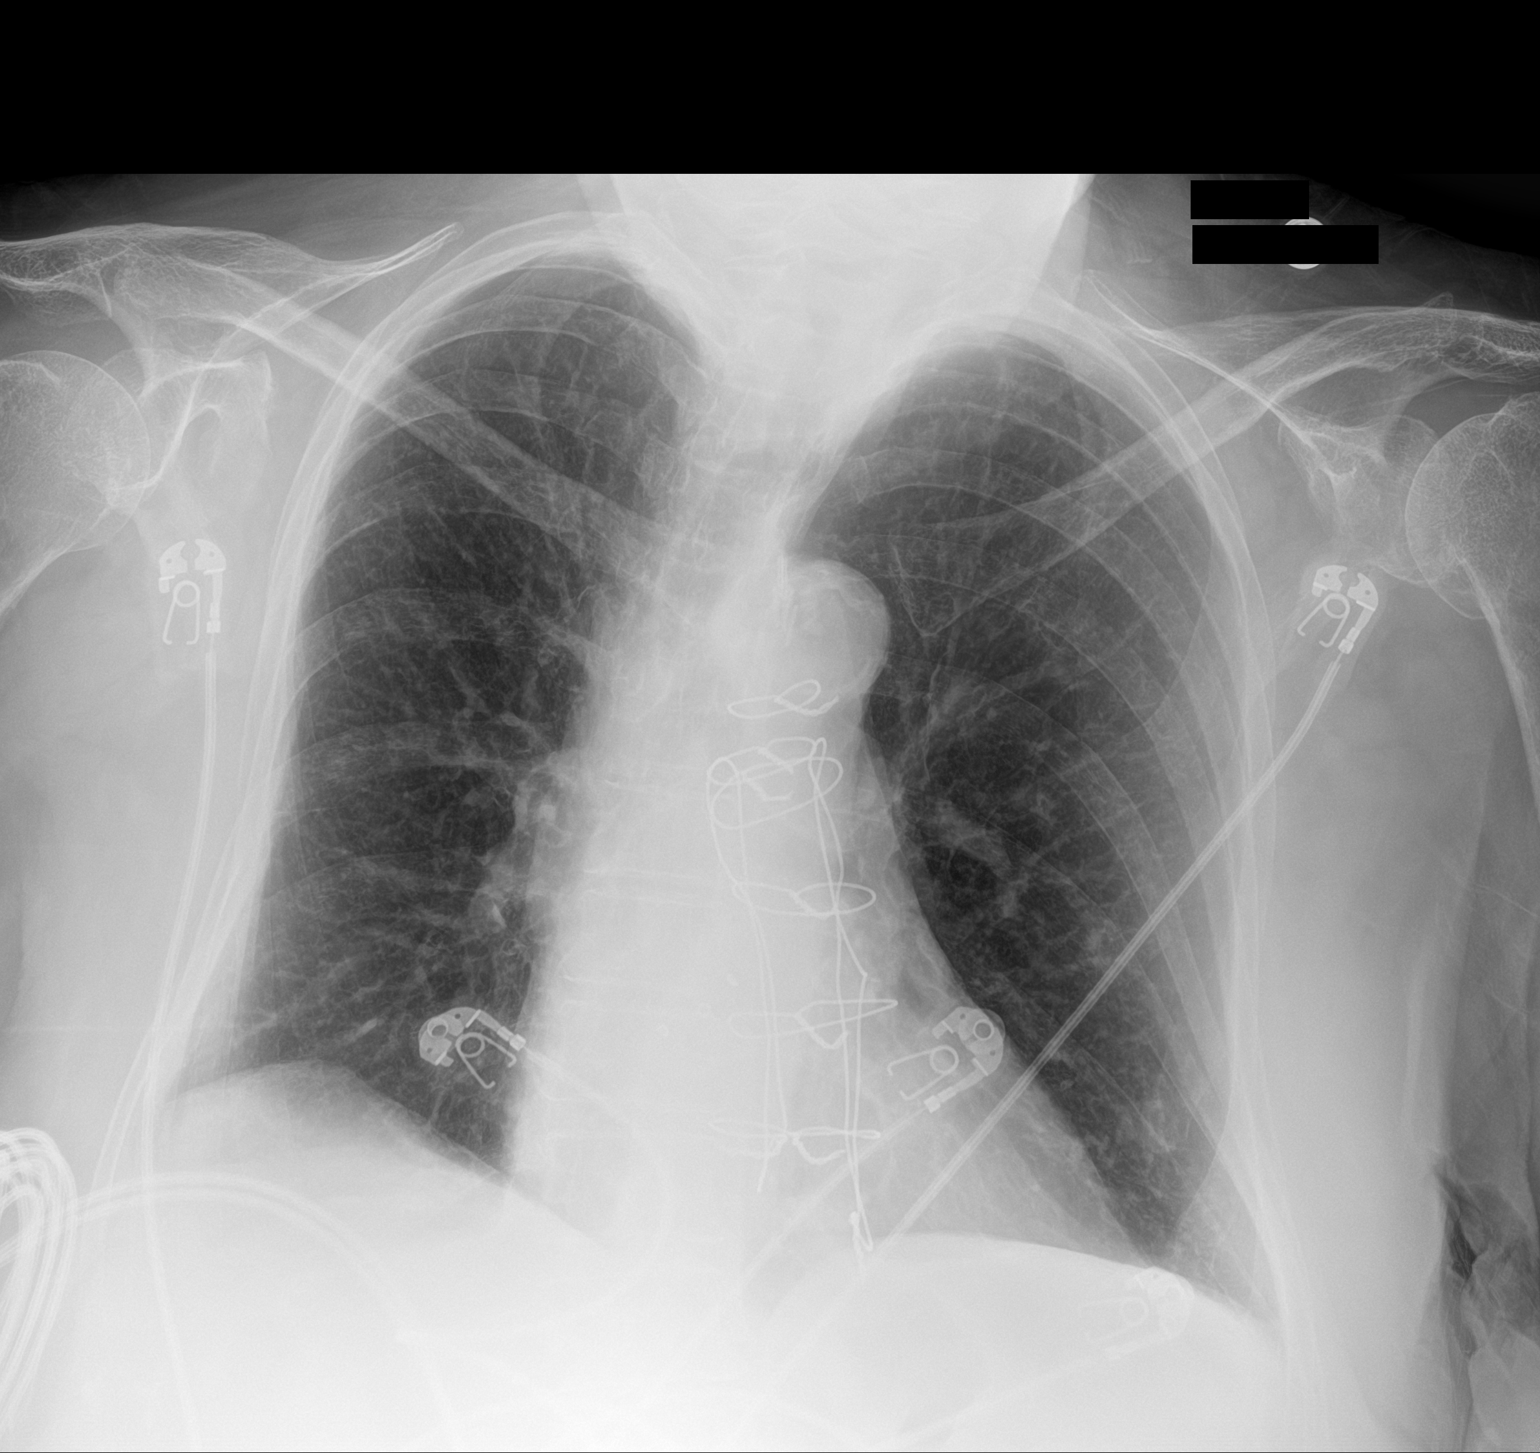

[1 of 1 positions shown; findings below may reference images not displayed]

FINDINGS: Post sternotomy changes. No focal consolidation or effusion. Normal
heart size. Aortic atherosclerosis. No pneumothorax.
IMPRESSION: No active disease.

## 2022-03-05 IMAGING — CT CT ABD-PELV W/ CM
2 of 5 series · 15 of 46 positions shown, 17 images · IV contrast (Omni 300)
Comparison: 10/14/2018 CT abdomen/pelvis.

CLINICAL DATA: Inpatient. Recurrent UTI. Chronic bladder mesh
erosion. Admitted with altered mental status.

EXAM:
CT ABDOMEN AND PELVIS WITH CONTRAST
TECHNIQUE: Multidetector CT imaging of the abdomen and pelvis was performed
using the standard protocol following bolus administration of
intravenous contrast.
CONTRAST:  100mL OMNIPAQUE IOHEXOL 300 MG/ML  SOLN

[Series 3: a/p w/ 5mm · axial · 0.98mm/px · z∈[+714,+1139]mm · 12 of 95 slices shown, 14 images]
[im 5/95  soft-tissue]
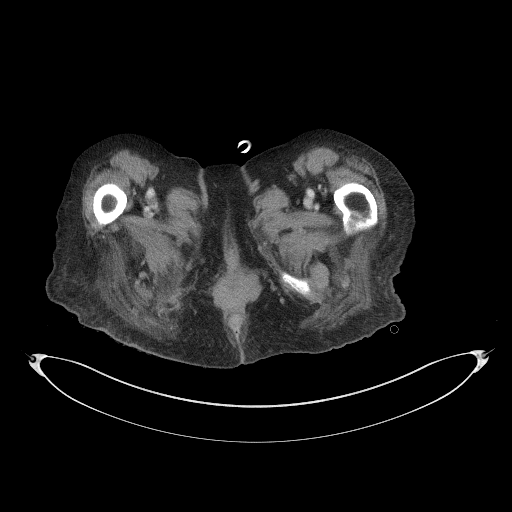
[im 5/95  bone]
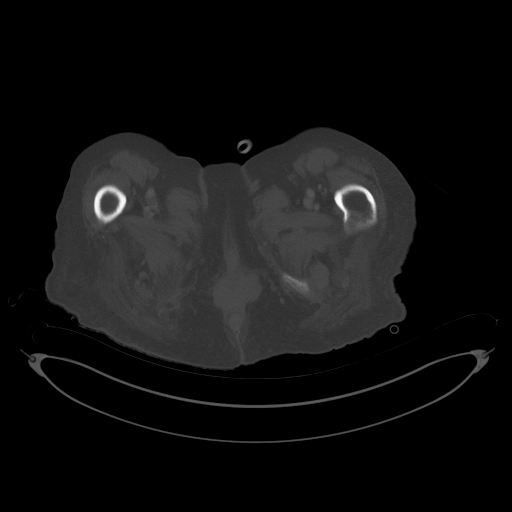
[im 15/95  soft-tissue]
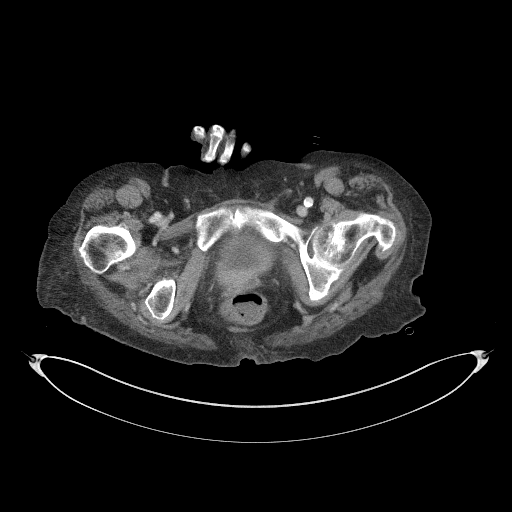
[im 20/95  soft-tissue]
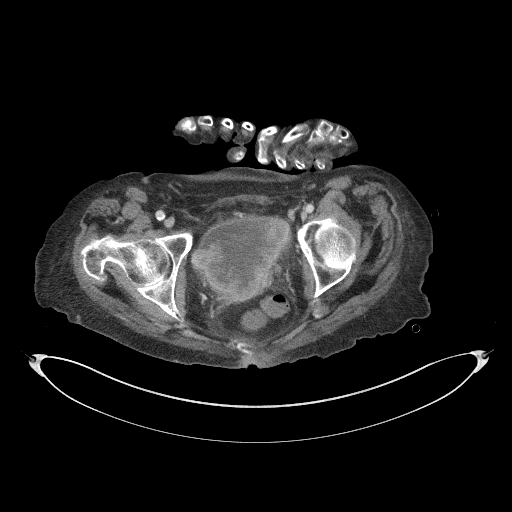
[im 30/95  soft-tissue]
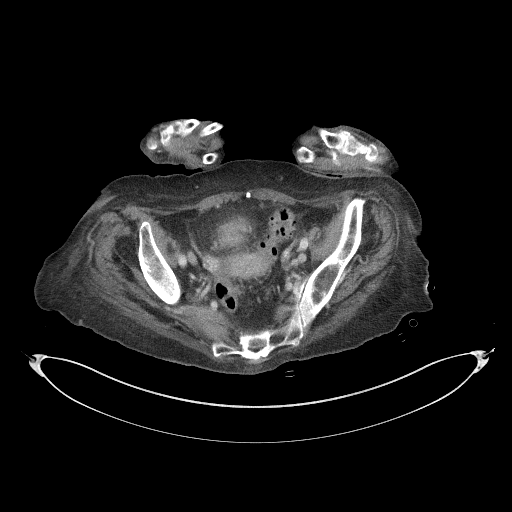
[im 35/95  soft-tissue]
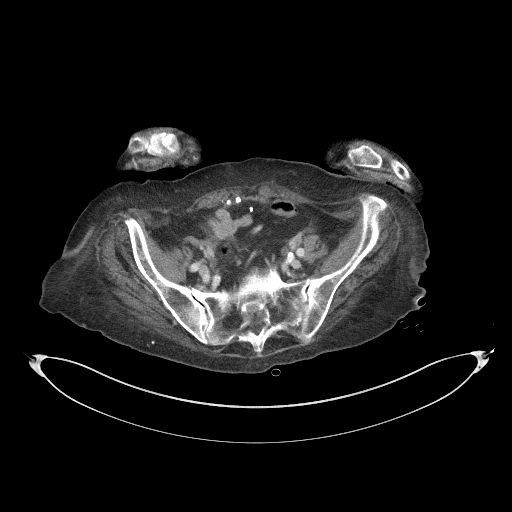
[im 45/95  soft-tissue]
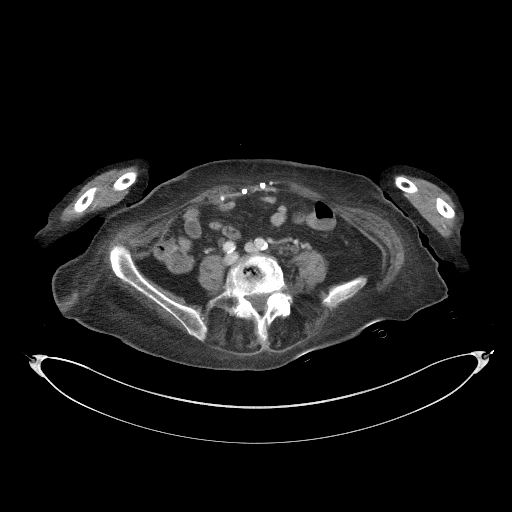
[im 50/95  soft-tissue]
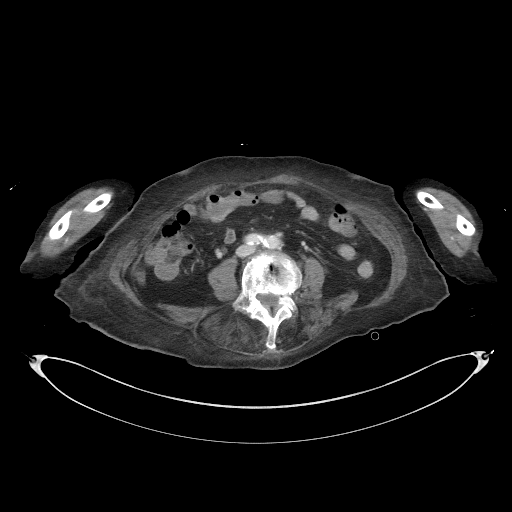
[im 60/95  soft-tissue]
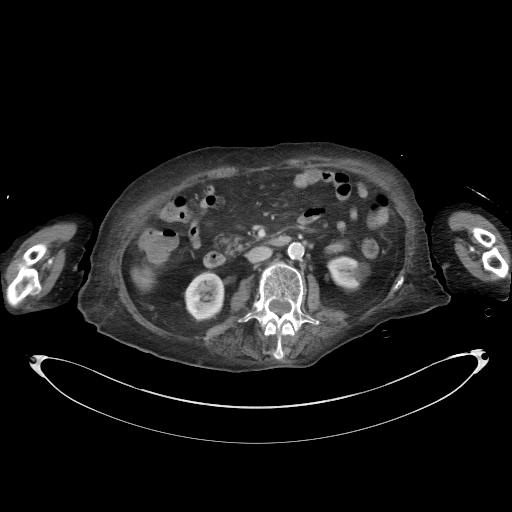
[im 65/95  soft-tissue]
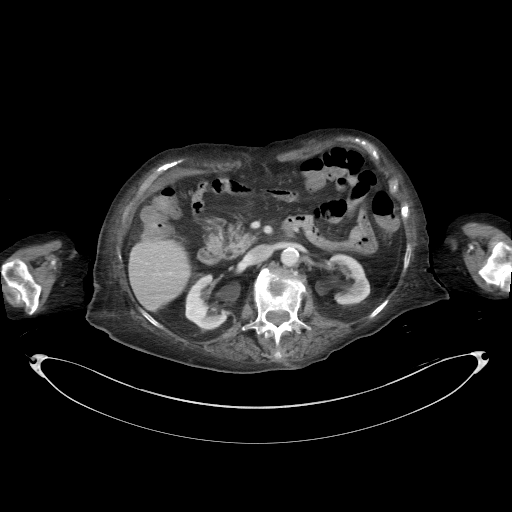
[im 65/95  bone]
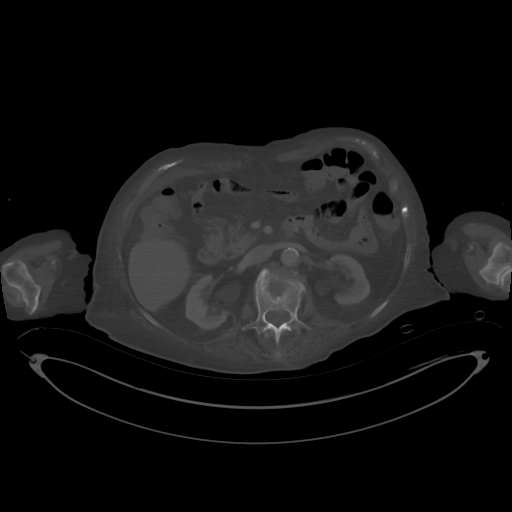
[im 75/95  soft-tissue]
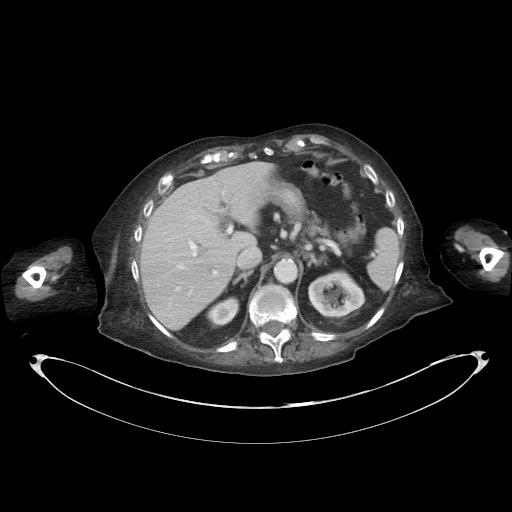
[im 80/95  soft-tissue]
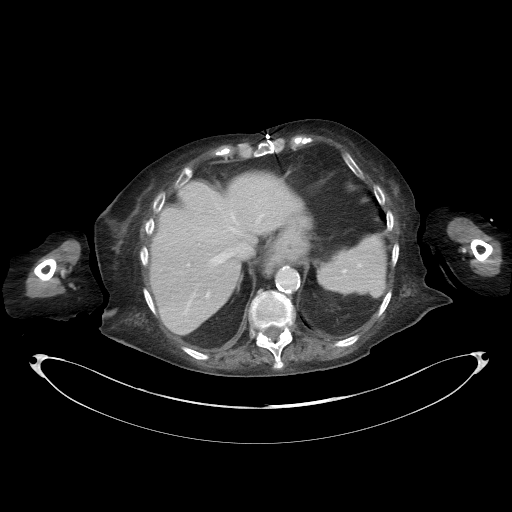
[im 90/95  soft-tissue]
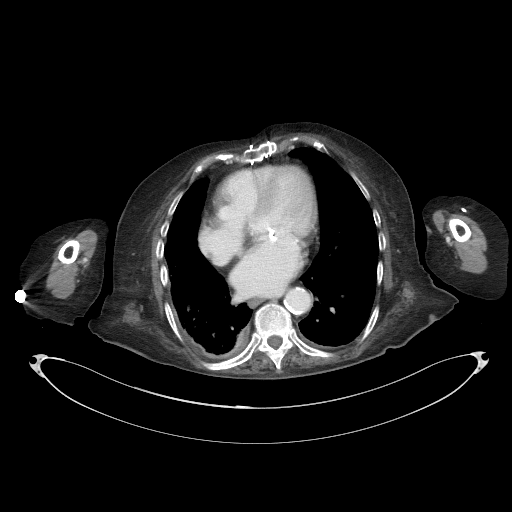

[Series 6: a/p w/ cor · coronal · 0.87mm/px · 3 of 147 slices shown]
[im 49/147  soft-tissue]
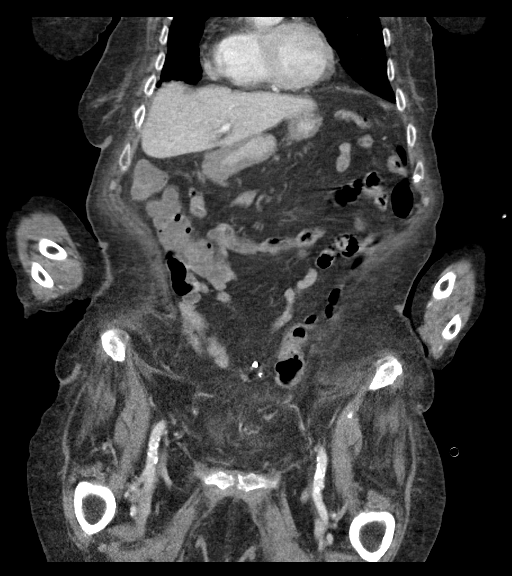
[im 65/147  soft-tissue]
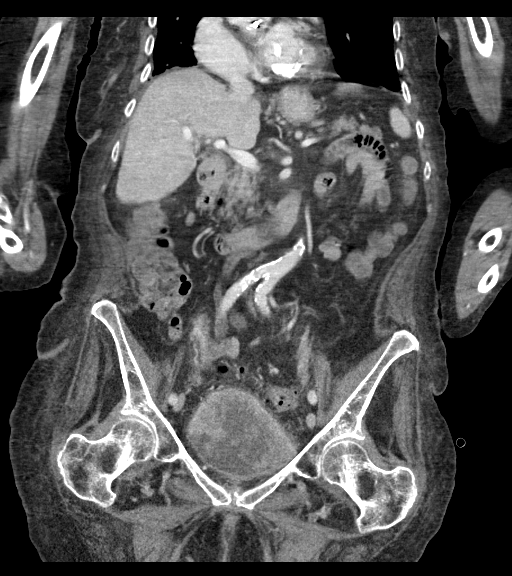
[im 82/147  soft-tissue]
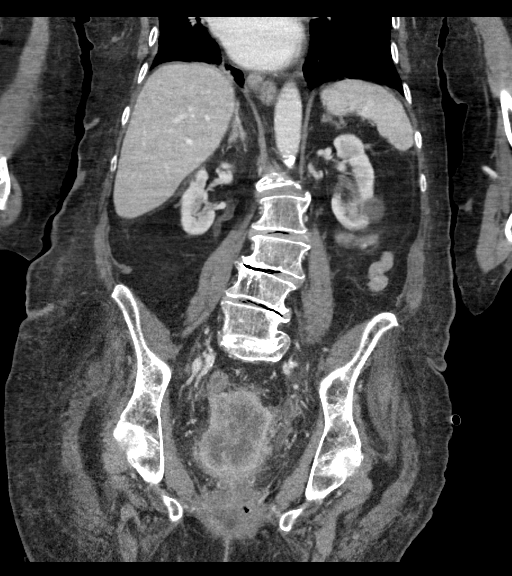

[15 of 46 positions shown; findings below may reference images not displayed]

FINDINGS: Lower chest: Trace dependent right pleural effusion with mild
dependent right lung base atelectasis. Intact lower sternotomy
wires. Mild cardiomegaly.

Hepatobiliary: Normal liver size. No liver mass. Cholecystectomy.
Bile ducts are within normal post cholecystectomy limits and are
stable. CBD diameter 6 mm.

Pancreas: Normal, with no mass or duct dilation.

Spleen: Normal size. No mass.

Adrenals/Urinary Tract: Normal adrenals. Simple 2.1 cm lower left
renal cyst. Scattered subcentimeter hypodense bilateral renal
cortical lesions are too small to characterize and require no
follow-up. Mild bilateral hydroureteronephrosis to the level of the
ureterovesical junction bilaterally. Severe masslike irregular wall
thickening throughout the entire posterior and superior bladder wall
with heterogeneous hyperenhancement, spanning up to 10.1 x 6.8 x
cm (series 3/image 76), new. Diffuse haziness of the perivesical
fat.

Stomach/Bowel: Small to moderate hiatal hernia. Otherwise normal
nondistended stomach. Normal caliber small bowel with no small bowel
wall thickening. Appendix not discretely visualized. No pericecal
inflammatory changes. Mild sigmoid diverticulosis. Suggestion of
mild wall thickening throughout the mid to distal sigmoid colon with
associated haziness of the pericolonic fat.

Vascular/Lymphatic: Atherosclerotic nonaneurysmal abdominal aorta.
Patent portal, splenic, hepatic and renal veins. No pathologically
enlarged lymph nodes in the abdomen or pelvis.

Reproductive: Status post hysterectomy, with no abnormal findings at
the vaginal cuff. No adnexal mass.

Other: No pneumoperitoneum, ascites or focal fluid collection.

Musculoskeletal: No aggressive appearing focal osseous lesions.
Chronic severe L1 vertebral compression fracture. Marked lumbar
spondylosis.
IMPRESSION: 1. Severe masslike irregular wall thickening and hyperenhancement
throughout the entire posterior and superior bladder wall, spanning
up to 10.1 x 6.8 x 7.7 cm, new, most suspicious for primary bladder
malignancy. A severe cystitis is on the differential, although
considered less likely. Nonspecific diffuse haziness of the
perivesical fat, which could represent secondary cystitis or fat
infiltration by tumor.
2. Mild bilateral hydroureteronephrosis to the level of the
ureterovesical junction bilaterally, probably due to suspected
bladder mass.
3. Trace dependent right pleural effusion.
4. Mild sigmoid diverticulosis. Suggestion of mild wall thickening
throughout the mid to distal sigmoid colon with associated haziness
of the pericolonic fat. No free air. No abscess. Findings are
nonspecific, potentially reactive to the bladder process, with
serosal tumor involvement not excluded.
5. No adenopathy or other findings suspicious for metastatic disease
in the abdomen or pelvis.
6. Small to moderate hiatal hernia.
7. Mild cardiomegaly.
8. Chronic severe L1 vertebral compression fracture.
9. Aortic Atherosclerosis ([id number]-[id number]).
